# Patient Record
Sex: Female | Born: 1975
Health system: Southern US, Community
[De-identification: ages and names within clinical notes are randomized; demographics above are authoritative.]

## PROBLEM LIST (undated history)

## (undated) DIAGNOSIS — F329 Major depressive disorder, single episode, unspecified: Secondary | ICD-10-CM

## (undated) DIAGNOSIS — F32A Depression, unspecified: Secondary | ICD-10-CM

## (undated) DIAGNOSIS — J45909 Unspecified asthma, uncomplicated: Secondary | ICD-10-CM

## (undated) DIAGNOSIS — L309 Dermatitis, unspecified: Secondary | ICD-10-CM

## (undated) DIAGNOSIS — G4730 Sleep apnea, unspecified: Secondary | ICD-10-CM

## (undated) DIAGNOSIS — I10 Essential (primary) hypertension: Secondary | ICD-10-CM

## (undated) DIAGNOSIS — F319 Bipolar disorder, unspecified: Secondary | ICD-10-CM

## (undated) DIAGNOSIS — F909 Attention-deficit hyperactivity disorder, unspecified type: Secondary | ICD-10-CM

## (undated) HISTORY — DX: Essential (primary) hypertension: I10

## (undated) HISTORY — PX: REDUCTION MAMMAPLASTY: SUR839

## (undated) HISTORY — DX: Attention-deficit hyperactivity disorder, unspecified type: F90.9

## (undated) HISTORY — DX: Unspecified asthma, uncomplicated: J45.909

## (undated) HISTORY — PX: CHOLECYSTECTOMY: SHX55

---

## 2012-07-19 ENCOUNTER — Encounter (HOSPITAL_COMMUNITY): Payer: Self-pay

## 2012-07-19 ENCOUNTER — Emergency Department (INDEPENDENT_AMBULATORY_CARE_PROVIDER_SITE_OTHER)
Admission: EM | Admit: 2012-07-19 | Discharge: 2012-07-19 | Disposition: A | Discharge status: Home / Self Care | Payer: Self-pay | Source: Home / Self Care | Attending: Emergency Medicine | Admitting: Emergency Medicine

## 2012-07-19 DIAGNOSIS — T148XXA Other injury of unspecified body region, initial encounter: Secondary | ICD-10-CM

## 2012-07-19 DIAGNOSIS — B86 Scabies: Secondary | ICD-10-CM

## 2012-07-19 HISTORY — DX: Depression, unspecified: F32.A

## 2012-07-19 HISTORY — DX: Dermatitis, unspecified: L30.9

## 2012-07-19 HISTORY — DX: Major depressive disorder, single episode, unspecified: F32.9

## 2012-07-19 LAB — POCT URINALYSIS DIP (DEVICE)
Bilirubin Urine: NEGATIVE
Glucose, UA: NEGATIVE mg/dL
Hgb urine dipstick: NEGATIVE
Ketones, ur: NEGATIVE mg/dL
Leukocytes, UA: NEGATIVE
Nitrite: NEGATIVE
Protein, ur: NEGATIVE mg/dL
Specific Gravity, Urine: 1.025 (ref 1.005–1.030)
Urobilinogen, UA: 0.2 mg/dL (ref 0.0–1.0)
pH: 6 (ref 5.0–8.0)

## 2012-07-19 LAB — POCT PREGNANCY, URINE: Preg Test, Ur: NEGATIVE

## 2012-07-19 MED ORDER — NAPROXEN 500 MG PO TABS: 500.0000 mg | ORAL_TABLET | Freq: Two times a day (BID) | ORAL | Status: AC

## 2012-07-19 MED ORDER — CYCLOBENZAPRINE HCL 5 MG PO TABS: 5.0000 mg | ORAL_TABLET | Freq: Three times a day (TID) | ORAL | Status: AC | PRN

## 2012-07-19 MED ORDER — PERMETHRIN 5 % EX CREA: TOPICAL_CREAM | CUTANEOUS | Status: AC

## 2012-07-19 NOTE — ED Provider Notes (Signed)
History     CSN: [id number]  Arrival date & time 07/19/12  1813   First MD Initiated Contact with Patient 07/19/12 1951      Chief Complaint  Patient presents with  . Back Pain  . Rash    (Consider location/radiation/quality/duration/timing/severity/associated sxs/prior treatment) HPI Comments: Pt reports moving here from [place] 3 weeks ago; in [facility], [place], she was very active, here she sits in the house all day and is very inactive. Began having back pain 4 days ago, woke up with it.  Denies injury.    Also reports was exposed to scabies a month ago, received some sort of treatment for it, and then 2 days later developed a rash on her L foot and ankle and L hand.  Describes early rash as looking like ant bites, then it opened up and now looks like a wound. Describes as very itchy.   Patient is a 36 y.o. female presenting with back pain and rash. The history is provided by the patient.  Back Pain  This is a new problem. Episode onset: 4 days ago. The problem occurs constantly. The problem has not changed since onset.The pain is associated with no known injury. The pain is present in the lumbar spine. The quality of the pain is described as aching. The pain does not radiate. The pain is at a severity of 3/10. The pain is mild. The symptoms are aggravated by bending and twisting. The pain is the same all the time. Pertinent negatives include no fever, no numbness, no abdominal pain, no bowel incontinence, no bladder incontinence, no dysuria and no weakness. She has tried nothing for the symptoms. Risk factors include obesity, lack of exercise and a sedentary lifestyle.  Rash  This is a new problem. Episode onset: a month ago. The problem has not changed since onset.The problem is associated with an unknown factor. There has been no fever. The rash is present on the left foot, left ankle and left hand. The patient is experiencing no pain. Associated symptoms include itching. She has tried nothing for the  symptoms.    Past Medical History  Diagnosis Date  . Depression   . Eczema     Past Surgical History  Procedure Date  . Cholecystectomy     History reviewed. No pertinent family history.  History  Substance Use Topics  . Smoking status: Current Everyday Smoker  . Smokeless tobacco: Not on file  . Alcohol Use: Yes    OB History    Grav Para Term Preterm Abortions TAB SAB Ect Mult Living                  Review of Systems  Constitutional: Negative for fever and chills.  Gastrointestinal: Negative for abdominal pain and bowel incontinence.  Genitourinary: Negative for bladder incontinence, dysuria, flank pain and difficulty urinating.  Musculoskeletal: Positive for back pain.  Skin: Positive for itching and rash.  Neurological: Negative for weakness and numbness.    Allergies  Review of patient's allergies indicates no known allergies.  Home Medications   Current Outpatient Rx  Name Route Sig Dispense Refill  . BUPROPION HCL ER (SR) 150 MG PO TB12 Oral Take 150 mg by mouth 2 (two) times daily.    Marland Kitchen RISPERIDONE 0.25 MG PO TABS Oral Take 6 mg by mouth at bedtime.    . SERTRALINE HCL 100 MG PO TABS Oral Take 100 mg by mouth daily.    . CYCLOBENZAPRINE HCL 5 MG PO TABS Oral Take  1 tablet (5 mg total) by mouth 3 (three) times daily as needed for muscle spasms. 21 tablet 0  . NAPROXEN 500 MG PO TABS Oral Take 1 tablet (500 mg total) by mouth 2 (two) times daily. 14 tablet 0  . PERMETHRIN 5 % EX CREA  Apply from neck down to soles of feet, wash off after 8-14 hours. 60 g 0    BP 150/94  Pulse 76  Temp 98.8 F (37.1 C) (Oral)  Resp 18  SpO2 95%  LMP 06/28/2012  Physical Exam  Constitutional: She appears well-developed and well-nourished. No distress.  Musculoskeletal:       Lumbar back: She exhibits tenderness. She exhibits normal range of motion, no bony tenderness, no swelling and no deformity.       Lumbar paraspinous muscles tender to palp, and tender with  ROM.    Skin: Skin is warm and dry. Rash noted.       3 clusters of open, ulcerated areas of skin on L medial ankle, L anterior proximal foot, and on L dorsal hand, c/w late scabies.      ED Course  Procedures (including critical care time)   Labs Reviewed  POCT URINALYSIS DIP (DEVICE)  POCT PREGNANCY, URINE   No results found.   1. Scabies   2. Muscle strain       MDM  Rash areas most likely scabies given exposure and rash appearance, however, no burrows noted.  Pt to monitor and seek further help if permethrin tx does not resolve rash.          Cathlyn Parsons, NP 07/19/12 2034

## 2012-07-19 NOTE — ED Notes (Signed)
C/o pain in center of lower back for 4 days.  Denies heavy lifting or strenuous activity.  States she thinks it is a UTI- denies urinary sx.  Also c/o itchy rash to lt foot for 1 month.

## 2012-07-20 NOTE — ED Provider Notes (Signed)
Medical screening examination/treatment/procedure(s) were performed by non-physician practitioner and as supervising physician I was immediately available for consultation/collaboration.  Raynald Blend, MD 07/20/12 1204

## 2013-10-09 ENCOUNTER — Ambulatory Visit: Payer: Self-pay | Attending: Internal Medicine

## 2013-10-10 ENCOUNTER — Ambulatory Visit: Payer: Self-pay

## 2013-11-27 ENCOUNTER — Ambulatory Visit: Payer: Self-pay | Admitting: Internal Medicine

## 2014-05-13 ENCOUNTER — Encounter (HOSPITAL_COMMUNITY): Payer: Self-pay | Admitting: Emergency Medicine

## 2014-05-13 ENCOUNTER — Emergency Department (HOSPITAL_COMMUNITY)
Admission: EM | Admit: 2014-05-13 | Discharge: 2014-05-13 | Disposition: A | Discharge status: Home / Self Care | Payer: BC Managed Care – PPO | Source: Home / Self Care

## 2014-05-13 DIAGNOSIS — M545 Low back pain, unspecified: Secondary | ICD-10-CM

## 2014-05-13 LAB — POCT URINALYSIS DIP (DEVICE)
Bilirubin Urine: NEGATIVE
Glucose, UA: NEGATIVE mg/dL
Hgb urine dipstick: NEGATIVE
Ketones, ur: NEGATIVE mg/dL
Leukocytes, UA: NEGATIVE
Nitrite: NEGATIVE
Protein, ur: NEGATIVE mg/dL
Specific Gravity, Urine: 1.015 (ref 1.005–1.030)
Urobilinogen, UA: 0.2 mg/dL (ref 0.0–1.0)
pH: 6.5 (ref 5.0–8.0)

## 2014-05-13 LAB — POCT PREGNANCY, URINE: Preg Test, Ur: NEGATIVE

## 2014-05-13 MED ORDER — DICLOFENAC POTASSIUM 50 MG PO TABS: 50.0000 mg | ORAL_TABLET | Freq: Three times a day (TID) | ORAL | Status: DC

## 2014-05-13 NOTE — Discharge Instructions (Signed)
Back Pain, Adult °Low back pain is very common. About 1 in 5 people have back pain. The cause of low back pain is rarely dangerous. The pain often gets better over time. About half of people with a sudden onset of back pain feel better in just 2 weeks. About 8 in 10 people feel better by 6 weeks.  °CAUSES °Some common causes of back pain include: °· Strain of the muscles or ligaments supporting the spine. °· Wear and tear (degeneration) of the spinal discs. °· Arthritis. °· Direct injury to the back. °DIAGNOSIS °Most of the time, the direct cause of low back pain is not known. However, back pain can be treated effectively even when the exact cause of the pain is unknown. Answering your caregiver's questions about your overall health and symptoms is one of the most accurate ways to make sure the cause of your pain is not dangerous. If your caregiver needs more information, he or she may order lab work or imaging tests (X-rays or MRIs). However, even if imaging tests show changes in your back, this usually does not require surgery. °HOME CARE INSTRUCTIONS °For many people, back pain returns. Since low back pain is rarely dangerous, it is often a condition that people can learn to manage on their own.  °· Remain active. It is stressful on the back to sit or stand in one place. Do not sit, drive, or stand in one place for more than 30 minutes at a time. Take short walks on level surfaces as soon as pain allows. Try to increase the length of time you walk each day. °· Do not stay in bed. Resting more than 1 or 2 days can delay your recovery. °· Do not avoid exercise or work. Your body is made to move. It is not dangerous to be active, even though your back may hurt. Your back will likely heal faster if you return to being active before your pain is gone. °· Pay attention to your body when you  bend and lift. Many people have less discomfort when lifting if they bend their knees, keep the load close to their bodies, and  avoid twisting. Often, the most comfortable positions are those that put less stress on your recovering back. °· Find a comfortable position to sleep. Use a firm mattress and lie on your side with your knees slightly bent. If you lie on your back, put a pillow under your knees. °· Only take over-the-counter or prescription medicines as directed by your caregiver. Over-the-counter medicines to reduce pain and inflammation are often the most helpful. Your caregiver may prescribe muscle relaxant drugs. These medicines help dull your pain so you can more quickly return to your normal activities and healthy exercise. °· Put ice on the injured area. °· Put ice in a plastic bag. °· Place a towel between your skin and the bag. °· Leave the ice on for 15-20 minutes, 03-04 times a day for the first 2 to 3 days. After that, ice and heat may be alternated to reduce pain and spasms. °· Ask your caregiver about trying back exercises and gentle massage. This may be of some benefit. °· Avoid feeling anxious or stressed. Stress increases muscle tension and can worsen back pain. It is important to recognize when you are anxious or stressed and learn ways to manage it. Exercise is a great option. °SEEK MEDICAL CARE IF: °· You have pain that is not relieved with rest or medicine. °· You have pain that does not improve in 1 week. °· You have new symptoms. °· You are generally not feeling well. °SEEK   IMMEDIATE MEDICAL CARE IF:  °· You have pain that radiates from your back into your legs. °· You develop new bowel or bladder control problems. °· You have unusual weakness or numbness in your arms or legs. °· You develop nausea or vomiting. °· You develop abdominal pain. °· You feel faint. °Document Released: 11/27/2005 Document Revised: 05/28/2012 Document Reviewed: 04/17/2011 °ExitCare® Patient Information ©2014 [employer]. ° °Lumbosacral Strain °Lumbosacral strain is a strain of any of the parts that make up your lumbosacral  vertebrae. Your lumbosacral vertebrae are the bones that make up the lower third of your backbone. Your lumbosacral vertebrae are held together by muscles and tough, fibrous tissue (ligaments).  °CAUSES  °A sudden blow to your back can cause lumbosacral strain. Also, anything that causes an excessive stretch of the muscles in the low back can cause this strain. This is typically seen when people exert themselves strenuously, fall, lift heavy objects, bend, or crouch repeatedly. °RISK FACTORS °· Physically demanding work. °· Participation in pushing or pulling sports or sports that require sudden twist of the back (tennis, golf, baseball). °· Weight lifting. °· Excessive lower back curvature. °· Forward-tilted pelvis. °· Weak back or abdominal muscles or both. °· Tight hamstrings. °SIGNS AND SYMPTOMS  °Lumbosacral strain may cause pain in the area of your injury or pain that moves (radiates) down your leg.  °DIAGNOSIS °Your health care provider can often diagnose lumbosacral strain through a physical exam. In some cases, you may need tests such as X-ray exams.  °TREATMENT  °Treatment for your lower back injury depends on many factors that your clinician will have to evaluate. However, most treatment will include the use of anti-inflammatory medicines. °HOME CARE INSTRUCTIONS  °· Avoid hard physical activities (tennis, racquetball, waterskiing) if you are not in proper physical condition for it. This may aggravate or create problems. °· If you have a back problem, avoid sports requiring sudden body movements. Swimming and walking are generally safer activities. °· Maintain good posture. °· Maintain a healthy weight. °· For acute conditions, you may put ice on the injured area. °· Put ice in a plastic bag. °· Place a towel between your skin and the bag. °· Leave the ice on for 20 minutes, 2 3 times a day. °· When the low back starts healing, stretching and strengthening exercises may be recommended. °SEEK MEDICAL CARE  IF: °· Your back pain is getting worse. °· You experience severe back pain not relieved with medicines. °SEEK IMMEDIATE MEDICAL CARE IF:  °· You have numbness, tingling, weakness, or problems with the use of your arms or legs. °· There is a change in bowel or bladder control. °· You have increasing pain in any area of the body, including your belly (abdomen). °· You notice shortness of breath, dizziness, or feel faint. °· You feel sick to your stomach (nauseous), are throwing up (vomiting), or become sweaty. °· You notice discoloration of your toes or legs, or your feet get very cold. °MAKE SURE YOU:  °· Understand these instructions. °· Will watch your condition. °· Will get help right away if you are not doing well or get worse. °Document Released: 09/06/2005 Document Revised: 09/17/2013 Document Reviewed: 07/16/2013 °ExitCare® Patient Information ©2014 [employer]. ° °

## 2014-05-13 NOTE — ED Provider Notes (Signed)
Medical screening examination/treatment/procedure(s) were performed by non-physician practitioner and as supervising physician I was immediately available for consultation/collaboration.  Leslee Home, M.D.  Reuben Likes, MD 05/13/14 1346

## 2014-05-13 NOTE — ED Notes (Signed)
C/o left lower back pain x 1 week. Pain worse first thing in AM, sometimes better after she gets up and moves around. Denies UA pain, frequency, urgency. 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

## 2014-05-13 NOTE — ED Provider Notes (Signed)
CSN: [id number]     Arrival date & time 05/13/14  [id number] History   None    Chief Complaint  Patient presents with  . Back Pain   (Consider location/radiation/quality/duration/timing/severity/associated sxs/prior Treatment) HPI Comments: 38 year old female complaining of low mid back pain for approximately one week. Pain is located in the lower lumbar spine approximately L4-L5. The pain is worse upon getting up in the morning, a little better when moving around and much worse with prolonged standing. There is no radiation of pain, no radicular pain. Denies numbness, tingling or focal weakness.   Past Medical History  Diagnosis Date  . Depression   . Eczema    Past Surgical History  Procedure Laterality Date  . Cholecystectomy     History reviewed. No pertinent family history. History  Substance Use Topics  . Smoking status: Current Every Day Smoker  . Smokeless tobacco: Not on file  . Alcohol Use: Yes   OB History   Grav Para Term Preterm Abortions TAB SAB Ect Mult Living                 Review of Systems  Constitutional: Negative for fever and activity change.  HENT: Negative.   Respiratory: Negative.   Cardiovascular: Negative.   Musculoskeletal: Positive for back pain. Negative for joint swelling, myalgias and neck pain.       As per HPI  Skin: Negative for color change and rash.  Neurological: Negative.     Allergies  Review of patient's allergies indicates no known allergies.  Home Medications   Prior to Admission medications   Medication Sig Start Date End Date Taking? Authorizing Provider  buPROPion (WELLBUTRIN SR) 150 MG 12 hr tablet Take 150 mg by mouth 2 (two) times daily.    Historical Provider, MD  diclofenac (CATAFLAM) 50 MG tablet Take 1 tablet (50 mg total) by mouth 3 (three) times daily. Prn back pain. Take with food. 05/13/14   Hayden Rasmussenavid Yariana Hoaglund, NP  risperiDONE (RISPERDAL) 0.25 MG tablet Take 6 mg by mouth at bedtime.    Historical Provider, MD  sertraline  (ZOLOFT) 100 MG tablet Take 100 mg by mouth daily.    Historical Provider, MD   BP 134/81  Pulse 61  Temp(Src) 98.3 F (36.8 C) (Oral)  Resp 18  SpO2 98% Physical Exam  Nursing note and vitals reviewed. Constitutional: She is oriented to person, place, and time. She appears well-developed and well-nourished. No distress.  HENT:  Head: Normocephalic and atraumatic.  Eyes: EOM are normal. Pupils are equal, round, and reactive to light.  Neck: Normal range of motion. Neck supple.  Cardiovascular: Normal rate.   Pulmonary/Chest: Effort normal. No respiratory distress.  Musculoskeletal: Normal range of motion. She exhibits no edema.  Minor tenderness over lower lumbar vertabra. No swelling, deformity or discoloration.  Forward flexion of spine left and right without limitation and minimal lumbar discomfort. Rotation is nl. Distal motor and sensory intact. Nl gait.  Neurological: She is alert and oriented to person, place, and time. No cranial nerve deficit.  Skin: Skin is warm and dry.  Psychiatric: She has a normal mood and affect.    ED Course  Procedures (including critical care time) Labs Review Labs Reviewed  POCT URINALYSIS DIP (DEVICE)  POCT PREGNANCY, URINE    Imaging Review No results found.   MDM   1. Low back pain     Stretches, heat or ice.  cataflam for pain prn No pulling and avoid prolonged standing.  Hayden Rasmussen, NP 05/13/14 [id number]  Hayden Rasmussen, NP 05/13/14 [phone]

## 2015-03-10 ENCOUNTER — Encounter (HOSPITAL_COMMUNITY): Payer: Self-pay | Admitting: Emergency Medicine

## 2015-03-10 ENCOUNTER — Emergency Department (HOSPITAL_COMMUNITY)
Admission: EM | Admit: 2015-03-10 | Discharge: 2015-03-10 | Disposition: A | Discharge status: Home / Self Care | Payer: 59 | Source: Home / Self Care | Attending: Family Medicine | Admitting: Family Medicine

## 2015-03-10 DIAGNOSIS — M545 Low back pain, unspecified: Secondary | ICD-10-CM

## 2015-03-10 LAB — POCT URINALYSIS DIP (DEVICE)
Bilirubin Urine: NEGATIVE
Glucose, UA: NEGATIVE mg/dL
Hgb urine dipstick: NEGATIVE
Ketones, ur: NEGATIVE mg/dL
Leukocytes, UA: NEGATIVE
Nitrite: NEGATIVE
Protein, ur: NEGATIVE mg/dL
Specific Gravity, Urine: 1.025 (ref 1.005–1.030)
Urobilinogen, UA: 0.2 mg/dL (ref 0.0–1.0)
pH: 6.5 (ref 5.0–8.0)

## 2015-03-10 LAB — POCT PREGNANCY, URINE: Preg Test, Ur: NEGATIVE

## 2015-03-10 MED ORDER — DICLOFENAC POTASSIUM 50 MG PO TABS: 50.0000 mg | ORAL_TABLET | Freq: Two times a day (BID) | ORAL | Status: DC

## 2015-03-10 NOTE — Discharge Instructions (Signed)
Thank you for coming in today. Come back or go to the emergency room if you notice new weakness new numbness problems walking or bowel or bladder problems. Call or go to the emergency room if you get worse, have trouble breathing, have chest pains, or palpitations.    Back Pain, Adult Low back pain is very common. About 1 in 5 people have back pain.The cause of low back pain is rarely dangerous. The pain often gets better over time.About half of people with a sudden onset of back pain feel better in just 2 weeks. About 8 in 10 people feel better by 6 weeks.  CAUSES Some common causes of back pain include:  Strain of the muscles or ligaments supporting the spine.  Wear and tear (degeneration) of the spinal discs.  Arthritis.  Direct injury to the back. DIAGNOSIS Most of the time, the direct cause of low back pain is not known.However, back pain can be treated effectively even when the exact cause of the pain is unknown.Answering your caregiver's questions about your overall health and symptoms is one of the most accurate ways to make sure the cause of your pain is not dangerous. If your caregiver needs more information, he or she may order lab work or imaging tests (X-rays or MRIs).However, even if imaging tests show changes in your back, this usually does not require surgery. HOME CARE INSTRUCTIONS For many people, back pain returns.Since low back pain is rarely dangerous, it is often a condition that people can learn to [facility] their own.   Remain active. It is stressful on the back to sit or stand in one place. Do not sit, drive, or stand in one place for more than 30 minutes at a time. Take short walks on level surfaces as soon as pain allows.Try to increase the length of time you walk each day.  Do not stay in bed.Resting more than 1 or 2 days can delay your recovery.  Do not avoid exercise or work.Your body is made to move.It is not dangerous to be active, even though your  back may hurt.Your back will likely heal faster if you return to being active before your pain is gone.  Pay attention to your body when you bend and lift. Many people have less discomfortwhen lifting if they bend their knees, keep the load close to their bodies,and avoid twisting. Often, the most comfortable positions are those that put less stress on your recovering back.  Find a comfortable position to sleep. Use a firm mattress and lie on your side with your knees slightly bent. If you lie on your back, put a pillow under your knees.  Only take over-the-counter or prescription medicines as directed by your caregiver. Over-the-counter medicines to reduce pain and inflammation are often the most helpful.Your caregiver may prescribe muscle relaxant drugs.These medicines help dull your pain so you can more quickly return to your normal activities and healthy exercise.  Put ice on the injured area.  Put ice in a plastic bag.  Place a towel between your skin and the bag.  Leave the ice on for 15-20 minutes, 03-04 times a day for the first 2 to 3 days. After that, ice and heat may be alternated to reduce pain and spasms.  Ask your caregiver about trying back exercises and gentle massage. This may be of some benefit.  Avoid feeling anxious or stressed.Stress increases muscle tension and can worsen back pain.It is important to recognize when you are anxious or stressed  and learn ways to manage it.Exercise is a great option. SEEK MEDICAL CARE IF:  You have pain that is not relieved with rest or medicine.  You have pain that does not improve in 1 week.  You have new symptoms.  You are generally not feeling well. SEEK IMMEDIATE MEDICAL CARE IF:   You have pain that radiates from your back into your legs.  You develop new bowel or bladder control problems.  You have unusual weakness or numbness in your arms or legs.  You develop nausea or vomiting.  You develop abdominal  pain.  You feel faint. Document Released: 11/27/2005 Document Revised: 05/28/2012 Document Reviewed: 03/31/2014 [facility] Patient Information 2015 [place], [place]. This information is not intended to replace advice given to you by your health care provider. Make sure you discuss any questions you have with your health care provider.  Orthostatic Hypotension Orthostatic hypotension is a sudden drop in blood pressure. It happens when you quickly stand up from a seated or lying position. You may feel dizzy or light-headed. This can last for just a few seconds or for up to a few minutes. It is usually not a serious problem. However, if this happens frequently or gets worse, it can be a sign of something more serious. CAUSES  Different things can cause orthostatic hypotension, including:   Loss of body fluids (dehydration).  Medicines that lower blood pressure.  Sudden changes in posture, such as standing up quickly after you have been sitting or lying down.  Taking too much of your medicine. SIGNS AND SYMPTOMS   Light-headedness or dizziness.   Fainting or near-fainting.   A fast heart rate.   Weakness.   Feeling tired (fatigue).  DIAGNOSIS  Your health care provider may do several things to help diagnose your condition and identify the cause. These may include:   Taking a medical history and doing a physical exam.  Checking your blood pressure. Your health care provider will check your blood pressure when you are:  Lying down.  Sitting.  Standing.  Using tilt table testing. In this test, you lie down on a table that moves from a lying position to a standing position. You will be strapped onto the table. This test monitors your blood pressure and heart rate when you are in different positions. TREATMENT  Treatment will vary depending on the cause. Possible treatments include:   Changing the dosage of your medicines.  Wearing compression stockings on your lower  legs.  Standing up slowly after sitting or lying down.  Eating more salt.  Eating frequent, small meals.  In some cases, getting IV fluids.  Taking medicine to enhance fluid retention. HOME CARE INSTRUCTIONS  Only take over-the-counter or prescription medicines as directed by your health care provider.  Follow your health care provider's instructions for changing the dosage of your current medicines.  Do not stop or adjust your medicine on your own.  Stand up slowly after sitting or lying down. This allows your body to adjust to the different position.  Wear compression stockings as directed.  Eat extra salt as directed.  Do not add extra salt to your diet unless directed to by your health care provider.  Eat frequent, small meals.  Avoid standing suddenly after eating.  Avoid hot showers or excessive heat as directed by your health care provider.  Keep all follow-up appointments. SEEK MEDICAL CARE IF:  You continue to feel dizzy or light-headed after standing.  You feel groggy or confused.  You  feel cold, clammy, or sick to your stomach (nauseous).  You have blurred vision.  You feel short of breath. SEEK IMMEDIATE MEDICAL CARE IF:   You faint after standing.  You have chest pain.  You have difficulty breathing.   You lose feeling or movement in your arms or legs.   You have slurred speech or difficulty talking, or you are unable to talk.  MAKE SURE YOU:   Understand these instructions.  Will watch your condition.  Will get help right away if you are not doing well or get worse. Document Released: 11/17/2002 Document Revised: 12/02/2013 Document Reviewed: 09/19/2013 [facility] Patient Information 2015 [place], [place]. This information is not intended to replace advice given to you by your health care provider. Make sure you discuss any questions you have with your health care provider.

## 2015-03-10 NOTE — ED Notes (Signed)
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

## 2015-03-10 NOTE — ED Provider Notes (Signed)
Julie Harrington is a 39 y.o. female who presents to Urgent Care today for back pain. Patient has a one-day history of right-sided low back pain. She felt a pulling sensation while at work yesterday. She denies any radiating pain weakness or numbness fevers or chills nausea vomiting or diarrhea. No chest pains or palpitations. She denies any urinary symptoms. She has not tried any medications yet.  Patient stood up to go to the bathroom and developed significant lightheadedness. She denies feeling as if she were to pass out or a room spinning sensation. She denies any palpitations or chest pain during these symptoms.   Past Medical History  Diagnosis Date  . Depression   . Eczema    Past Surgical History  Procedure Laterality Date  . Cholecystectomy     History  Substance Use Topics  . Smoking status: Current Every Day Smoker  . Smokeless tobacco: Not on file  . Alcohol Use: Yes   ROS as above Medications: No current facility-administered medications for this encounter.   Current Outpatient Prescriptions  Medication Sig Dispense Refill  . buPROPion (WELLBUTRIN SR) 150 MG 12 hr tablet Take 150 mg by mouth 2 (two) times daily.    . risperiDONE (RISPERDAL) 0.25 MG tablet Take 6 mg by mouth at bedtime.    . sertraline (ZOLOFT) 100 MG tablet Take 100 mg by mouth daily.    . diclofenac (CATAFLAM) 50 MG tablet Take 1 tablet (50 mg total) by mouth 2 (two) times daily. Prn back pain. Take with food. 30 tablet 0   No Known Allergies   Exam:  BP 145/73 mmHg  Pulse 64  Temp(Src) 98.4 F (36.9 C) (Oral)  Resp 18  SpO2 99%  Orthostatic VS for the past 24 hrs:  BP- Lying Pulse- Lying BP- Sitting Pulse- Sitting BP- Standing at 0 minutes Pulse- Standing at 0 minutes  03/10/15 1254 151/84 mmHg 55 153/88 mmHg 65 (!) 162/95 mmHg 78    Gen: Well NAD HEENT: EOMI,  MMM Lungs: Normal work of breathing. CTABL Heart: RRR no MRG Abd: NABS, Soft. Nondistended, Nontender no CV angle tenderness to  percussion Exts: Brisk capillary refill, warm and well perfused.  Tach: Nontender to spinal midline. Tender palpation right SI joint. Negative straight leg and Faber test bilaterally. Reflexes are equal and normal bilateral knees and ankles. Neuro: Alert and oriented cranial nerves II through XII are intact normal gait and balance coordination   ED ECG REPORT   Date: 03/10/2015  Rate: 56  Rhythm: sinus bradycardia  QRS Axis: normal  Intervals: normal  ST/T Wave abnormalities: normal  Conduction Disutrbances:none  Narrative Interpretation:   Old EKG Reviewed: none available  I have personally reviewed the EKG tracing and agree with the computerized printout as noted.  Results for orders placed or performed during the hospital encounter of 03/10/15 (from the past 24 hour(s))  POCT urinalysis dip (device)     Status: None   Collection Time: 03/10/15 12:41 PM  Result Value Ref Range   Glucose, UA NEGATIVE NEGATIVE mg/dL   Bilirubin Urine NEGATIVE NEGATIVE   Ketones, ur NEGATIVE NEGATIVE mg/dL   Specific Gravity, Urine 1.025 1.005 - 1.030   Hgb urine dipstick NEGATIVE NEGATIVE   pH 6.5 5.0 - 8.0   Protein, ur NEGATIVE NEGATIVE mg/dL   Urobilinogen, UA 0.2 0.0 - 1.0 mg/dL   Nitrite NEGATIVE NEGATIVE   Leukocytes, UA NEGATIVE NEGATIVE  Pregnancy, urine POC     Status: None   Collection Time: 03/10/15 12:41  PM  Result Value Ref Range   Preg Test, Ur NEGATIVE NEGATIVE   No results found.  Assessment and Plan: 39 y.o. female with  1) lumbago due to myofascial disruption. Treat with diclofenac and watchful waiting. 2) dizziness. Very transient. Patient is completely asymptomatic currently. EKG is normal with exception of mild sinus bradycardia. Orthostatic she notes significant decrease in blood pressure. Recommend follow-up with PCP.  Discussed warning signs or symptoms. Please see discharge instructions. Patient expresses understanding.     Rodolph BongEvan S Melvia Matousek, MD 03/10/15 [phone]

## 2015-06-02 ENCOUNTER — Encounter (HOSPITAL_COMMUNITY): Payer: Self-pay | Admitting: Emergency Medicine

## 2015-06-02 ENCOUNTER — Emergency Department (HOSPITAL_COMMUNITY)
Admission: EM | Admit: 2015-06-02 | Discharge: 2015-06-02 | Disposition: A | Discharge status: Home / Self Care | Payer: 59 | Source: Home / Self Care | Attending: Emergency Medicine | Admitting: Emergency Medicine

## 2015-06-02 DIAGNOSIS — M545 Low back pain, unspecified: Secondary | ICD-10-CM

## 2015-06-02 MED ORDER — CYCLOBENZAPRINE HCL 10 MG PO TABS: 10.0000 mg | ORAL_TABLET | Freq: Every evening | ORAL | Status: DC | PRN

## 2015-06-02 MED ORDER — DICLOFENAC POTASSIUM 50 MG PO TABS: 50.0000 mg | ORAL_TABLET | Freq: Two times a day (BID) | ORAL | Status: DC

## 2015-06-02 NOTE — ED Notes (Signed)
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

## 2015-06-02 NOTE — Discharge Instructions (Signed)
You likely pulled a muscle in your back. Take diclofenac twice a day for the next 3 days, then as needed. Take Flexeril at bedtime as needed. Alternate ice and heat to lower back. You should see improvement in the next few days, but this will take 2 weeks to fully heal. Follow-up as needed.

## 2015-06-02 NOTE — ED Provider Notes (Signed)
CSN: [id number]     Arrival date & time 06/02/15  1648 History   First MD Initiated Contact with Patient 06/02/15 1701     Chief Complaint  Patient presents with  . Back Pain   (Consider location/radiation/quality/duration/timing/severity/associated sxs/prior Treatment) HPI  She is a 39 year old woman here for evaluation of low back pain. This started yesterday. The pain is located in her tailbone. It is worse with standing and sitting as well as flexion and extension. If she is just sitting at rest, it does not really hurt. She has not tried any treatments. 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 No bowel or bladder incontinence. No radicular pain. No numbness, tingling, weakness in her lower extremities. No dysuria or urinary frequency.  Past Medical History  Diagnosis Date  . Depression   . Eczema    Past Surgical History  Procedure Laterality Date  . Cholecystectomy     History reviewed. No pertinent family history. History  Substance Use Topics  . Smoking status: Current Every Day Smoker  . Smokeless tobacco: Not on file  . Alcohol Use: Yes   OB History    No data available     Review of Systems As in history of present illness Allergies  Review of patient's allergies indicates no known allergies.  Home Medications   Prior to Admission medications   Medication Sig Start Date End Date Taking? Authorizing Provider  buPROPion (WELLBUTRIN SR) 150 MG 12 hr tablet Take 150 mg by mouth 2 (two) times daily.    Historical Provider, MD  cyclobenzaprine (FLEXERIL) 10 MG tablet Take 1 tablet (10 mg total) by mouth at bedtime as needed for muscle spasms. 06/02/15   Charm Rings, MD  diclofenac (CATAFLAM) 50 MG tablet Take 1 tablet (50 mg total) by mouth 2 (two) times daily. Prn back pain. Take with food. 06/02/15   Charm Rings, MD  risperiDONE (RISPERDAL) 0.25 MG tablet Take 6 mg by mouth at bedtime.    Historical Provider, MD  sertraline (ZOLOFT) 100 MG tablet  Take 100 mg by mouth daily.    Historical Provider, MD   BP 142/84 mmHg  Pulse 73  Temp(Src) 99.1 F (37.3 C) (Oral)  Resp 14  SpO2 100% Physical Exam  Constitutional: She is oriented to person, place, and time. She appears well-developed and well-nourished. No distress.  Cardiovascular: Normal rate.   Pulmonary/Chest: Effort normal.  Musculoskeletal:  Back: No erythema or edema. No vertebral tenderness or step-offs. No point tenderness. No appreciable muscle spasm. She has full active range of motion, but some pain with flexion and extension. Negative straight leg raise bilaterally. 5 out of 5 strength in bilateral lower extremities.  Neurological: She is alert and oriented to person, place, and time.    ED Course  Procedures (including critical care time) Labs Review Labs Reviewed - No data to display  Imaging Review No results found.   MDM   1. Midline low back pain without sciatica    Conservative management with ice/heat, diclofenac, Flexeril. Work note provided. Follow-up as needed.    Charm Rings, MD 06/02/15 [phone]

## 2015-09-22 ENCOUNTER — Encounter (HOSPITAL_COMMUNITY): Payer: Self-pay | Admitting: Emergency Medicine

## 2015-09-22 ENCOUNTER — Emergency Department (HOSPITAL_COMMUNITY)
Admission: EM | Admit: 2015-09-22 | Discharge: 2015-09-22 | Disposition: A | Discharge status: Home / Self Care | Payer: 59 | Source: Home / Self Care | Attending: Family Medicine | Admitting: Family Medicine

## 2015-09-22 DIAGNOSIS — M222X1 Patellofemoral disorders, right knee: Secondary | ICD-10-CM

## 2015-09-22 MED ORDER — DICLOFENAC SODIUM 75 MG PO TBEC: 75.0000 mg | DELAYED_RELEASE_TABLET | Freq: Two times a day (BID) | ORAL | Status: DC

## 2015-09-22 NOTE — Discharge Instructions (Signed)
You have developed patellofemoral syndrome this is due to the kneecap rubbing irregularly over the knee joint. Please start the leg exercises demonstrated. The handout below has additional exercise he can consider trying. Please use the Voltaren for additional pain relief. If her symptoms do not improve please follow-up with orthopedic surgery or sports medicine.

## 2015-09-22 NOTE — ED Provider Notes (Signed)
CSN: [id number]     Arrival date & time 09/22/15  1528 History   First MD Initiated Contact with Patient 09/22/15 1546     Chief Complaint  Patient presents with  . Knee Pain   (Consider location/radiation/quality/duration/timing/severity/associated sxs/prior Treatment) HPI   R knee pain. Started this morning when pt awoke from sleep. Just above kneecap. No radiation. Difficulty bending knee. Has not tried anything for the pain. No change in exercise routine or work environment. Constant w/ waxing and waning nature.  Worse w/ bending knee but not when standing.   Denies any swelling, rash, other joint involvement, fevers, chest pain, shortness of breath.   Past Medical History  Diagnosis Date  . Depression   . Eczema    Past Surgical History  Procedure Laterality Date  . Cholecystectomy     Family History  Problem Relation Age of Onset  . Hypertension Other   . Diabetes Other    Social History  Substance Use Topics  . Smoking status: Current Every Day Smoker  . Smokeless tobacco: None  . Alcohol Use: Yes   OB History    No data available     Review of Systems  Allergies  Review of patient's allergies indicates no known allergies.  Home Medications   Prior to Admission medications   Medication Sig Start Date End Date Taking? Authorizing Provider  amLODipine (NORVASC) 5 MG tablet Take 5 mg by mouth daily.   Yes Historical Provider, MD  hydrochlorothiazide (MICROZIDE) 12.5 MG capsule Take 12.5 mg by mouth daily.   Yes Historical Provider, MD  buPROPion (WELLBUTRIN SR) 150 MG 12 hr tablet Take 150 mg by mouth 2 (two) times daily.    Historical Provider, MD  cyclobenzaprine (FLEXERIL) 10 MG tablet Take 1 tablet (10 mg total) by mouth at bedtime as needed for muscle spasms. 06/02/15   Charm Rings, MD  diclofenac (CATAFLAM) 50 MG tablet Take 1 tablet (50 mg total) by mouth 2 (two) times daily. Prn back pain. Take with food. 06/02/15   Charm Rings, MD  diclofenac  (VOLTAREN) 75 MG EC tablet Take 1 tablet (75 mg total) by mouth 2 (two) times daily. 09/22/15   Ozella Rocks, MD  risperiDONE (RISPERDAL) 0.25 MG tablet Take 6 mg by mouth at bedtime.    Historical Provider, MD  sertraline (ZOLOFT) 100 MG tablet Take 100 mg by mouth daily.    Historical Provider, MD   Meds Ordered and Administered this Visit  Medications - No data to display  BP 100/69 mmHg  Pulse 77  Temp(Src) 98.7 F (37.1 C) (Oral)  Resp 16  SpO2 97% No data found.   Physical Exam Physical Exam  Constitutional: oriented to person, place, and time. appears well-developed and well-nourished. No distress.  HENT:  Head: Normocephalic and atraumatic.  Eyes: EOMI. PERRL.  Neck: Normal range of motion.  Cardiovascular: RRR, no m/r/g, 2+ distal pulses,  Pulmonary/Chest: Effort normal and breath sounds normal. No respiratory distress.  Abdominal: Soft. Bowel sounds are normal. NonTTP, no distension.  Musculoskeletal: Right knee full range of motion, mild crepitus appreciated, Lachman's negative, valgus and varus stresses without pain, apprehension test negative. Neurological: alert and oriented to person, place, and time.  Skin: Skin is warm. No rash noted. non diaphoretic.  Psychiatric: normal mood and affect. behavior is normal. Judgment and thought content normal.   ED Course  Procedures (including critical care time)  Labs Review Labs Reviewed - No data to display  Imaging Review  No results found.   Visual Acuity Review  Right Eye Distance:   Left Eye Distance:   Bilateral Distance:    Right Eye Near:   Left Eye Near:    Bilateral Near:         MDM   1. PFS (patellofemoral syndrome), right    Meloxicam, extremity exercises, follow-up with primary care physician, sports medicine or ortho    Ozella Rocksavid J Vivien Barretto, MD 09/22/15 1626

## 2015-09-22 NOTE — ED Notes (Signed)
Pain in right knee, pain worse with ambulation/weight bearing.

## 2018-11-28 ENCOUNTER — Emergency Department (HOSPITAL_COMMUNITY)
Admission: EM | Admit: 2018-11-28 | Discharge: 2018-11-28 | Disposition: A | Discharge status: Home / Self Care | Payer: BLUE CROSS/BLUE SHIELD | Attending: Emergency Medicine | Admitting: Emergency Medicine

## 2018-11-28 ENCOUNTER — Other Ambulatory Visit: Payer: Self-pay

## 2018-11-28 ENCOUNTER — Encounter (HOSPITAL_COMMUNITY): Payer: Self-pay | Admitting: *Deleted

## 2018-11-28 DIAGNOSIS — L403 Pustulosis palmaris et plantaris: Secondary | ICD-10-CM | POA: Diagnosis not present

## 2018-11-28 DIAGNOSIS — Z79899 Other long term (current) drug therapy: Secondary | ICD-10-CM | POA: Diagnosis not present

## 2018-11-28 DIAGNOSIS — R21 Rash and other nonspecific skin eruption: Secondary | ICD-10-CM | POA: Diagnosis not present

## 2018-11-28 DIAGNOSIS — L408 Other psoriasis: Secondary | ICD-10-CM | POA: Diagnosis not present

## 2018-11-28 MED ORDER — TRIAMCINOLONE ACETONIDE 0.1 % EX CREA: 1.0000 "application " | TOPICAL_CREAM | Freq: Two times a day (BID) | CUTANEOUS | 0 refills | Status: DC

## 2018-11-28 MED ORDER — CEPHALEXIN 500 MG PO CAPS: 500.0000 mg | ORAL_CAPSULE | Freq: Four times a day (QID) | ORAL | 0 refills | Status: DC

## 2018-11-28 NOTE — ED Provider Notes (Signed)
[facility]   CSN: [id number] Arrival date & time: 11/28/18  0848     History   Chief Complaint Chief Complaint  Patient presents with  . Rash    HPI Julie Harrington is a 42 y.o. female.  The history is provided by the patient. No language interpreter was used.  Rash       42 year old female presenting concerning of a rash.  Patient has a history of pustular eczema.  She reports she is having increasing flare on her pain involving her eczema.  For the past 2 weeks it has become more intense.  She complaining of pain, mild itchiness, increased swelling.  Symptom is moderate in severity.  She attributed to having to use hand soap and glove at work on a regular basis.  She denies any fever, throat swelling, trouble breathing, wheezing or abdominal cramping.  She report that the rash only involve her hands.  She does have a dermatologist but the next available appointment is in February.  She was using some types of cream in the past but she no longer has access to the cream.  Past Medical History:  Diagnosis Date  . Depression   . Eczema     There are no active problems to display for this patient.   Past Surgical History:  Procedure Laterality Date  . CHOLECYSTECTOMY       OB History   No obstetric history on file.      Home Medications    Prior to Admission medications   Medication Sig Start Date End Date Taking? Authorizing Provider  amLODipine (NORVASC) 5 MG tablet Take 5 mg by mouth daily.    [provider]  buPROPion (WELLBUTRIN SR) 150 MG 12 hr tablet Take 150 mg by mouth 2 (two) times daily.    [provider]  cyclobenzaprine (FLEXERIL) 10 MG tablet Take 1 tablet (10 mg total) by mouth at bedtime as needed for muscle spasms. 06/02/15   Charm RingsHonig, Erin J, MD  diclofenac (CATAFLAM) 50 MG tablet Take 1 tablet (50 mg total) by mouth 2 (two) times daily. Prn back pain. Take with food. 06/02/15   Charm RingsHonig,  Erin J, MD  diclofenac (VOLTAREN) 75 MG EC tablet Take 1 tablet (75 mg total) by mouth 2 (two) times daily. 09/22/15   Ozella RocksMerrell, David J, MD  hydrochlorothiazide (MICROZIDE) 12.5 MG capsule Take 12.5 mg by mouth daily.    [provider]  risperiDONE (RISPERDAL) 0.25 MG tablet Take 6 mg by mouth at bedtime.    [provider]  sertraline (ZOLOFT) 100 MG tablet Take 100 mg by mouth daily.    [provider]    Family History Family History  Problem Relation Age of Onset  . Hypertension Other   . Diabetes Other     Social History Social History   Tobacco Use  . Smoking status: Current Every Day Smoker  Substance Use Topics  . Alcohol use: Yes  . Drug use: No     Allergies   Patient has no known allergies.   Review of Systems Review of Systems  Constitutional: Negative for fever.  Skin: Positive for rash.     Physical Exam Updated Vital Signs BP (!) 152/90 (BP Location: Right Arm)   Pulse 70   Temp 98.4 F (36.9 C) (Oral)   Resp 18   SpO2 98%   Physical Exam Vitals signs and nursing note reviewed.  Constitutional:  General: She is not in acute distress.    Appearance: She is well-developed.  HENT:     Head: Atraumatic.  Eyes:     Conjunctiva/sclera: Conjunctivae normal.  Neck:     Musculoskeletal: Neck supple.  Skin:    Findings: Rash (Bilateral hands: Psoriatic skin changes with pustular skin eruption noted to multiple digits with mild erythematous plaque to the dorsum of hands.) present.  Neurological:     Mental Status: She is alert.      ED Treatments / Results  Labs (all labs ordered are listed, but only abnormal results are displayed) Labs Reviewed - No data to display  EKG None  Radiology No results found.  Procedures Procedures (including critical care time)  Medications Ordered in ED Medications - No data to display   Initial Impression / Assessment and Plan / ED Course  I have reviewed the triage  vital signs and the nursing notes.  Pertinent labs & imaging results that were available during my care of the patient were reviewed by me and considered in my medical decision making (see chart for details).     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  She has history of eczema.  She does not have any systemic manifestation.  Her condition has been ongoing for the past several weeks.  She would be best managed by a dermatologist as medications that will help with the symptom is outside the confines of the ER protocol.  Due to potential risk of infection, I will prescribe oral antibiotic to use in the event that her symptoms manifests to degree consistent with infection.  Discussed this with patient.  Since topical corticosteroid can be used sparingly, I will prescribe a mid potency topical steroid cream to use in the meantime.  Patient will follow with dermatologist for further care.   Fayrene Helperran, [place]meka Filice, PA-C 11/28/18 0930    Shaune PollackIsaacs, Cameron, MD 11/30/18 [phone]

## 2018-11-28 NOTE — ED Notes (Signed)
Pt verbalized understanding of d/c instructions and has no further questions, VSS, NAD.  

## 2018-11-28 NOTE — Discharge Instructions (Signed)
Use topical creme twice daily for no more than a week. Take keflex only if you notice sign of infection.  Please call and follow up with dermatology as needed for further care.

## 2018-11-28 NOTE — ED Triage Notes (Signed)
States she has eczema and states its the worse its ever been . On her hands

## 2018-12-16 DIAGNOSIS — Z23 Encounter for immunization: Secondary | ICD-10-CM | POA: Diagnosis not present

## 2018-12-16 DIAGNOSIS — I10 Essential (primary) hypertension: Secondary | ICD-10-CM | POA: Diagnosis not present

## 2018-12-16 DIAGNOSIS — F17210 Nicotine dependence, cigarettes, uncomplicated: Secondary | ICD-10-CM | POA: Diagnosis not present

## 2018-12-16 DIAGNOSIS — L309 Dermatitis, unspecified: Secondary | ICD-10-CM | POA: Diagnosis not present

## 2019-01-13 DIAGNOSIS — L732 Hidradenitis suppurativa: Secondary | ICD-10-CM | POA: Diagnosis not present

## 2019-01-13 DIAGNOSIS — L209 Atopic dermatitis, unspecified: Secondary | ICD-10-CM | POA: Diagnosis not present

## 2019-01-14 DIAGNOSIS — I10 Essential (primary) hypertension: Secondary | ICD-10-CM | POA: Diagnosis not present

## 2019-01-14 DIAGNOSIS — F17210 Nicotine dependence, cigarettes, uncomplicated: Secondary | ICD-10-CM | POA: Diagnosis not present

## 2019-04-15 DIAGNOSIS — F17210 Nicotine dependence, cigarettes, uncomplicated: Secondary | ICD-10-CM | POA: Diagnosis not present

## 2019-04-15 DIAGNOSIS — I10 Essential (primary) hypertension: Secondary | ICD-10-CM | POA: Diagnosis not present

## 2020-02-24 ENCOUNTER — Other Ambulatory Visit: Payer: Self-pay

## 2020-02-24 MED ORDER — HALOBETASOL PROPIONATE 0.05 % EX OINT: TOPICAL_OINTMENT | Freq: Every day | CUTANEOUS | 1 refills | Status: DC

## 2020-03-16 ENCOUNTER — Emergency Department (HOSPITAL_COMMUNITY): Payer: BC Managed Care – PPO

## 2020-03-16 ENCOUNTER — Emergency Department (HOSPITAL_COMMUNITY)
Admission: EM | Admit: 2020-03-16 | Discharge: 2020-03-16 | Disposition: A | Discharge status: Home / Self Care | Payer: BC Managed Care – PPO | Attending: Emergency Medicine | Admitting: Emergency Medicine

## 2020-03-16 ENCOUNTER — Encounter (HOSPITAL_COMMUNITY): Payer: Self-pay

## 2020-03-16 DIAGNOSIS — F17210 Nicotine dependence, cigarettes, uncomplicated: Secondary | ICD-10-CM | POA: Diagnosis not present

## 2020-03-16 DIAGNOSIS — K122 Cellulitis and abscess of mouth: Secondary | ICD-10-CM | POA: Diagnosis not present

## 2020-03-16 DIAGNOSIS — L259 Unspecified contact dermatitis, unspecified cause: Secondary | ICD-10-CM | POA: Diagnosis not present

## 2020-03-16 DIAGNOSIS — R21 Rash and other nonspecific skin eruption: Secondary | ICD-10-CM | POA: Diagnosis not present

## 2020-03-16 DIAGNOSIS — Z79899 Other long term (current) drug therapy: Secondary | ICD-10-CM | POA: Diagnosis not present

## 2020-03-16 DIAGNOSIS — J029 Acute pharyngitis, unspecified: Secondary | ICD-10-CM | POA: Diagnosis not present

## 2020-03-16 DIAGNOSIS — R221 Localized swelling, mass and lump, neck: Secondary | ICD-10-CM | POA: Diagnosis not present

## 2020-03-16 LAB — BASIC METABOLIC PANEL
Anion gap: 9 (ref 5–15)
BUN: 10 mg/dL (ref 6–20)
CO2: 26 mmol/L (ref 22–32)
Calcium: 8.9 mg/dL (ref 8.9–10.3)
Chloride: 106 mmol/L (ref 98–111)
Creatinine, Ser: 1.06 mg/dL — ABNORMAL HIGH (ref 0.44–1.00)
GFR calc Af Amer: >60 mL/min (ref >60)
GFR calc non Af Amer: >60 mL/min (ref >60)
Glucose, Bld: 123 mg/dL — ABNORMAL HIGH (ref 70–99)
Potassium: 3.7 mmol/L (ref 3.5–5.1)
Sodium: 141 mmol/L (ref 135–145)

## 2020-03-16 LAB — CBC WITH DIFFERENTIAL/PLATELET
Abs Immature Granulocytes: 0.01 10*3/uL (ref 0.00–0.07)
Basophils Absolute: 0 10*3/uL (ref 0.0–0.1)
Basophils Relative: 0 %
Eosinophils Absolute: 0.3 10*3/uL (ref 0.0–0.5)
Eosinophils Relative: 4 %
HCT: 38.3 % (ref 36.0–46.0)
Hemoglobin: 12.1 g/dL (ref 12.0–15.0)
Immature Granulocytes: 0 %
Lymphocytes Relative: 29 %
Lymphs Abs: 1.9 10*3/uL (ref 0.7–4.0)
MCH: 30.3 pg (ref 26.0–34.0)
MCHC: 31.6 g/dL (ref 30.0–36.0)
MCV: 96 fL (ref 80.0–100.0)
Monocytes Absolute: 0.6 10*3/uL (ref 0.1–1.0)
Monocytes Relative: 10 %
Neutro Abs: 3.7 10*3/uL (ref 1.7–7.7)
Neutrophils Relative %: 57 %
Platelets: 248 10*3/uL (ref 150–400)
RBC: 3.99 MIL/uL (ref 3.87–5.11)
RDW: 13.3 % (ref 11.5–15.5)
WBC: 6.5 10*3/uL (ref 4.0–10.5)
nRBC: 0 % (ref 0.0–0.2)

## 2020-03-16 LAB — I-STAT BETA HCG BLOOD, ED (MC, WL, AP ONLY): I-stat hCG, quantitative: <5 m[IU]/mL (ref <5)

## 2020-03-16 LAB — GROUP A STREP BY PCR: Group A Strep by PCR: NOT DETECTED

## 2020-03-16 MED ORDER — HYDROCORTISONE 2.5 % EX LOTN: TOPICAL_LOTION | Freq: Two times a day (BID) | CUTANEOUS | 0 refills | Status: DC

## 2020-03-16 MED ORDER — DEXAMETHASONE SODIUM PHOSPHATE 10 MG/ML IJ SOLN
10.0000 mg | Freq: Once | INTRAMUSCULAR | Status: AC
  Administered 2020-03-16: 10 mg via INTRAVENOUS
  Filled 2020-03-16: qty 1

## 2020-03-16 MED ORDER — VANCOMYCIN HCL 1.5 G IV SOLR
1500.0000 mg | Freq: Once | INTRAVENOUS | Status: DC
  Filled 2020-03-16: qty 1500

## 2020-03-16 MED ORDER — SODIUM CHLORIDE 0.9 % IV SOLN: 1.0000 g | Freq: Once | INTRAVENOUS | Status: DC

## 2020-03-16 MED ORDER — KETOROLAC TROMETHAMINE 30 MG/ML IJ SOLN
30.0000 mg | Freq: Once | INTRAMUSCULAR | Status: AC
  Administered 2020-03-16: 30 mg via INTRAVENOUS
  Filled 2020-03-16: qty 1

## 2020-03-16 NOTE — ED Provider Notes (Signed)
MSE was initiated and I personally evaluated the patient and placed orders (if any) at  4:17 AM on March 16, 2020.  The patient appears stable so that the remainder of the MSE may be completed by another provider.  44 year old female who presents to the emergency department with a generalized rash to the bilateral arms, face, and upper back that began 2 days ago.  Rash is pruritic.  She attempted to use over-the-counter corticosteroids with improvement of itching.  No change in coloration.  Rash has not been worsening since onset.  She reports that she developed a sore throat yesterday.  She works night shift and awoke at approximately 02:30 with significantly worsening sore throat.  She states that her throat feels swollen and she is somewhat short of breath.  She has been able to swallow liquids since symptoms worsened.  No treatment prior to arrival.  No fever, chills, cough, chest pain, headache, sinus pain or pressure, drooling, trismus.  On exam, uvula is edematous.  Posterior oropharynx is not fully visualized.  She has right-sided anterior cervical lymphadenopathy.  No posterior cervical lymphadenopathy.  There is no trismus.  She is tolerating secretions without difficulty.  No stridor.  Lungs are clear to auscultation bilaterally.  No increased work of breathing or respiratory distress.  Heart is regular rate and rhythm.  There is a diffuse maculopapular rash noted to the bilateral arms, upper back, and face.  The bilateral palms are spared.  Given her age, she has not been vaccinated for each flu.  There is concern for epiglottitis given rapidly worsening sore throat.  She is not tripoding and is tolerating secretions at this time.  Will order soft tissue x-ray of the neck.  Given her diffuse sandpaper rash, there is concern for streptococcal infection.  Will order strep PCR and basic labs.   Frederik Pear A, PA-C 03/16/20 0441    Ward, Layla Maw, DO 03/16/20 [phone]

## 2020-03-16 NOTE — Discharge Instructions (Addendum)
Thank you for allowing me to care for you today in the Emergency Department.   You were seen today for sore throat and a rash.  Your sore throat appears to be from swelling of the uvula, which is called uvulitis.  This is likely due to a virus.  This should resolve on its own.  You can take 600 mg of ibuprofen with food once every 6 hours as needed for pain or swelling.  Additional instructions on interesting your pain and swelling are attached along with these discharge instructions.  If your symptoms do not significantly improve within the next 45 days, please follow-up with ENT.  Their information is listed above.  For the rash, you can use the hydrocortisone 2.5% lotion on your face 1-2 times daily.  Do not use this more than 2 weeks.  If the rash does not significantly improve in that time, please follow-up with primary care.  Return to the emergency department if you develop significantly worsening swelling to your neck, shortness of breath, respiratory distress, if you pass out, if you develop drooling, if you become unable to swallow your own saliva, or other new, concerning symptoms.

## 2020-03-16 NOTE — ED Triage Notes (Signed)
Pt states that she broke out in hives a couple days ago and are starting to get better with benadryl, pt woke up tonight and feels that he throat is swelling and it is difficult to swallow, denies SOB, some swelling to back of throat noted

## 2020-03-16 NOTE — ED Notes (Signed)
Pt tolerating PO fluids

## 2020-03-16 NOTE — ED Provider Notes (Signed)
[facility] EMERGENCY DEPARTMENT Provider Note   CSN: [id number] Arrival date & time: 03/16/20  0347     History Chief Complaint  Patient presents with  . Allergic Reaction    Julie Harrington is a 44 y.o. female who presents to the emergency department with a generalized rash to the bilateral arms, face, and upper back that began 2 days ago.  Rash is pruritic.  She attempted to use over-the-counter corticosteroids for eczema with improvement of itching. Rashes appeared unchanged since onset.  Denies recent soaps, lotions, hygiene products.  No new medications.  No contacts with similar rash.  She reports that she developed a sore throat yesterday.  She works night shift and awoke at approximately 02:30 with significantly worsening sore throat.    Initially, she was feeling short of breath and felt as if her throat was swollen.  She has been able to swallow liquids since symptoms worsened when she awoke.    She reports that her voice is gradually started to sound more muffled since she awoke.  No treatment prior to arrival.  No fever, chills, cough, chest pain, headache, sinus pain or pressure, drooling, trismus, dental pain, nasal congestion, rhinorrhea, loss of sense of taste or smell, neck pain or stiffness, or visual changes.  No history of similar.  No known sick contacts.  She is a current, every day smoker.  The history is provided by the patient. No language interpreter was used.       Past Medical History:  Diagnosis Date  . Depression   . Eczema     There are no problems to display for this patient.   Past Surgical History:  Procedure Laterality Date  . CHOLECYSTECTOMY       OB History   No obstetric history on file.     Family History  Problem Relation Age of Onset  . Hypertension Other   . Diabetes Other     Social History   Tobacco Use  . Smoking status: Current Every Day Smoker  . Smokeless tobacco: Never Used  Substance Use Topics  .  Alcohol use: Yes  . Drug use: No    Home Medications Prior to Admission medications   Medication Sig Start Date End Date Taking? Authorizing Provider  amLODipine (NORVASC) 5 MG tablet Take 5 mg by mouth daily.    [provider]  buPROPion (WELLBUTRIN SR) 150 MG 12 hr tablet Take 150 mg by mouth 2 (two) times daily.    [provider]  cephALEXin (KEFLEX) 500 MG capsule Take 1 capsule (500 mg total) by mouth 4 (four) times daily. 11/28/18   Fayrene Helper, PA-C  cyclobenzaprine (FLEXERIL) 10 MG tablet Take 1 tablet (10 mg total) by mouth at bedtime as needed for muscle spasms. 06/02/15   Charm Rings, MD  diclofenac (CATAFLAM) 50 MG tablet Take 1 tablet (50 mg total) by mouth 2 (two) times daily. Prn back pain. Take with food. 06/02/15   Charm Rings, MD  diclofenac (VOLTAREN) 75 MG EC tablet Take 1 tablet (75 mg total) by mouth 2 (two) times daily. 09/22/15   Ozella Rocks, MD  halobetasol (ULTRAVATE) 0.05 % ointment Apply topically daily. 02/24/20   Janalyn Harder, MD  hydrochlorothiazide (MICROZIDE) 12.5 MG capsule Take 12.5 mg by mouth daily.    [provider]  hydrocortisone 2.5 % lotion Apply topically 2 (two) times daily. 03/16/20   Lavere Stork A, PA-C  risperiDONE (RISPERDAL) 0.25 MG tablet Take 6  mg by mouth at bedtime.    [provider]  sertraline (ZOLOFT) 100 MG tablet Take 100 mg by mouth daily.    [provider]  triamcinolone cream (KENALOG) 0.1 % Apply 1 application topically 2 (two) times daily. 11/28/18   Fayrene Helper, PA-C    Allergies    Patient has no known allergies.  Review of Systems   Review of Systems  Constitutional: Negative for activity change, chills, fatigue and fever.  HENT: Positive for sore throat, trouble swallowing and voice change. Negative for congestion, dental problem, drooling, facial swelling, mouth sores, nosebleeds, postnasal drip, sinus pressure, sinus pain and tinnitus.   Respiratory: Negative for  cough, shortness of breath and wheezing.   Cardiovascular: Negative for chest pain, palpitations and leg swelling.  Gastrointestinal: Negative for abdominal pain.  Genitourinary: Negative for dysuria.  Musculoskeletal: Negative for back pain.  Skin: Positive for rash. Negative for color change and wound.  Allergic/Immunologic: Negative for immunocompromised state.  Neurological: Negative for weakness and headaches.  Psychiatric/Behavioral: Negative for confusion.    Physical Exam Updated Vital Signs BP 130/89   Pulse (!) 50   Temp 98 F (36.7 C) (Oral)   Resp 14   SpO2 99%   Physical Exam Vitals and nursing note reviewed.  Constitutional:      General: She is not in acute distress.    Appearance: She is not ill-appearing, toxic-appearing or diaphoretic.  HENT:     Head: Normocephalic.     Mouth/Throat:     Mouth: Mucous membranes are moist.     Palate: No mass and lesions.     Pharynx: Uvula midline. Uvula swelling present. No oropharyngeal exudate or posterior oropharyngeal erythema.     Tonsils: 1+ on the right. 1+ on the left.     Comments: Uvula is edematous, but midline.  Unable to fully visualize the posterior oropharynx.  There is no tripoding.  No trismus.  No drooling.  Patient is tolerating secretions without difficulty.  No sublingual edema or induration. Eyes:     Conjunctiva/sclera: Conjunctivae normal.  Neck:     Comments: Right-sided anterior cervical lymphadenopathy.  No left-sided anterior cervical lymphadenopathy.  No posterior cervical lymphadenopathy.  No meningismus. Cardiovascular:     Rate and Rhythm: Normal rate and regular rhythm.     Heart sounds: No murmur. No friction rub. No gallop.   Pulmonary:     Effort: Pulmonary effort is normal. No respiratory distress.     Breath sounds: No stridor. No wheezing, rhonchi or rales.     Comments: Lungs are clear to auscultation bilaterally.  No increased work of breathing.  Patient is able to speak in  complete, fluent sentences without increased work of breathing.  There is no stridor. Chest:     Chest wall: No tenderness.  Abdominal:     General: There is no distension.     Palpations: Abdomen is soft.  Musculoskeletal:     Cervical back: Neck supple.  Skin:    General: Skin is warm.     Findings: No rash.     Comments: Generalized maculopapular rash noted to the bilateral arms, face, and upper back.  The legs, anterior chest, abdomen, and lower back are spared.  No vesicles, desquamation, pustules or scaling. No overlying erythema or warmth.  No fluctuance or induration.  Neurological:     Mental Status: She is alert.  Psychiatric:        Behavior: Behavior normal.     ED Results /  Procedures / Treatments   Labs (all labs ordered are listed, but only abnormal results are displayed) Labs Reviewed  BASIC METABOLIC PANEL - Abnormal; Notable for the following components:      Result Value   Glucose, Bld 123 (*)    Creatinine, Ser 1.06 (*)    All other components within normal limits  GROUP A STREP BY PCR  CBC WITH DIFFERENTIAL/PLATELET  I-STAT BETA HCG BLOOD, ED ([facility], [facility], [facility] ONLY)    EKG None  Radiology DG Neck Soft Tissue  Result Date: 03/16/2020 CLINICAL DATA:  Recent hives with sore throat this morning and uvula swelling. EXAM: NECK SOFT TISSUES - 1+ VIEW COMPARISON:  None. FINDINGS: Possible lower epiglottis thickening, but this could be related to coaptation. No retropharyngeal soft tissue thickening. The larynx appears unremarkable. No osseous findings. IMPRESSION: 1. Epiglottis thickening versus coaptation with the tongue base. 2. The airway remains patent. Electronically Signed   By: Monte Fantasia M.D.   On: 03/16/2020 05:24    Procedures Procedures (including critical care time)  Medications Ordered in ED Medications  dexamethasone (DECADRON) injection 10 mg (10 mg Intravenous Given 03/16/20 0609)  ketorolac (TORADOL) 30 MG/ML injection 30 mg (30 mg Intravenous  Given 03/16/20 0630)    ED Course  I have reviewed the triage vital signs and the nursing notes.  Pertinent labs & imaging results that were available during my care of the patient were reviewed by me and considered in my medical decision making (see chart for details).    MDM Rules/Calculators/A&P                      44 year old female with a history of eczema presenting with generalized rash to the bilateral arms, upper back, and face that began 2 days ago.  The patient then awoke prior to arrival with sore throat accompanied by rapidly worsening swelling and shortness of breath.  The patient was seen and fully evaluated by Dr. Leonides Schanz, attending physician.  On exam, she has no shortness of breath.  Lungs are clear.  There is no stridor.  Uvula is edematous, but midline.  She is tolerating secretions without difficulty and there is no trismus.  No tripoding.  Unable to fully visualize the posterior oropharynx.  Given edematous uvula, I am concerned for uvulitis versus epiglottitis, especially given rapid onset of symptoms.  Decadron ordered.  X-ray of the soft tissue of the neck with concern for epiglottis thickening versus coaptation with the tongue base.  However, patient's labs are unremarkable.  She has no leukocytosis.  No left shift.  Metabolic panel is unremarkable.  On reevaluation, she reports marked improvement after receiving Decadron.  She has been successfully fluid challenged.  Vital signs remain normal.  Initially considered ordering blood cultures, giving antibiotics, and ordering CT soft tissue of the neck, but after discussing patient with Dr. Leonides Schanz and seeing marked improvement since the patient arrived in the ER, suspect uvulitis, likely secondary to viral source.  She was also given a dose of Toradol for inflammation in the ER.  I think given how well-appearing she is that it is reasonable to discharge her to home with symptomatic treatments and referral to ENT if symptoms do not  resolve in the next 4 to 5 days.  Will also give her strict return precautions to the ER for new or worsening symptoms.  Given the patient's rash there is initial concern for scarlatiniform rash.  Strep PCR was ordered and was negative.  On reevaluation after discussing the patient with Dr. Elesa Massed, rash appears more concerning for contact dermatitis.  She has had a recent exposures or hygiene products.  She has been using topical corticosteroids with improvement of symptoms on the back and arms.  Will give the patient a low potency corticosteroid to use on the face.  She was advised not to use this longer for 2 weeks and was given precautions for using it prior to going out in direct sunlight.  Doubt HSV, SJS, TENS, shingles, or cellulitis.  Doubt PTA, retropharyngeal abscess, Ludwig's angina, epiglottitis, mononucleosis, streptococcal pharyngitis, anaphylaxis, or allergic reaction.  The patient is hemodynamically stable and in no acute distress.  Safe for discharge home with outpatient follow-up as needed.  Final Clinical Impression(s) / ED Diagnoses Final diagnoses:  Uvulitis  Contact dermatitis, unspecified contact dermatitis type, unspecified trigger    Rx / DC Orders ED Discharge Orders         Ordered    hydrocortisone 2.5 % lotion  2 times daily     03/16/20 0700           Pegge Cumberledge A, PA-C 03/16/20 0831    Ward, Layla Maw, DO 03/18/20 2341

## 2020-03-19 ENCOUNTER — Encounter (HOSPITAL_COMMUNITY): Payer: Self-pay | Admitting: Emergency Medicine

## 2020-03-19 ENCOUNTER — Emergency Department (HOSPITAL_COMMUNITY)
Admission: EM | Admit: 2020-03-19 | Discharge: 2020-03-20 | Disposition: A | Discharge status: Home / Self Care | Payer: BC Managed Care – PPO | Attending: Emergency Medicine | Admitting: Emergency Medicine

## 2020-03-19 ENCOUNTER — Other Ambulatory Visit: Payer: Self-pay

## 2020-03-19 DIAGNOSIS — K122 Cellulitis and abscess of mouth: Secondary | ICD-10-CM | POA: Insufficient documentation

## 2020-03-19 DIAGNOSIS — Z79899 Other long term (current) drug therapy: Secondary | ICD-10-CM | POA: Insufficient documentation

## 2020-03-19 DIAGNOSIS — F17210 Nicotine dependence, cigarettes, uncomplicated: Secondary | ICD-10-CM | POA: Diagnosis not present

## 2020-03-19 DIAGNOSIS — R21 Rash and other nonspecific skin eruption: Secondary | ICD-10-CM | POA: Insufficient documentation

## 2020-03-19 LAB — I-STAT CHEM 8, ED
BUN: 16 mg/dL (ref 6–20)
Calcium, Ion: 1.32 mmol/L (ref 1.15–1.40)
Chloride: 107 mmol/L (ref 98–111)
Creatinine, Ser: 1.2 mg/dL — ABNORMAL HIGH (ref 0.44–1.00)
Glucose, Bld: 106 mg/dL — ABNORMAL HIGH (ref 70–99)
HCT: 39 % (ref 36.0–46.0)
Hemoglobin: 13.3 g/dL (ref 12.0–15.0)
Potassium: 3.7 mmol/L (ref 3.5–5.1)
Sodium: 145 mmol/L (ref 135–145)
TCO2: 27 mmol/L (ref 22–32)

## 2020-03-19 MED ORDER — DEXAMETHASONE SODIUM PHOSPHATE 10 MG/ML IJ SOLN
10.0000 mg | Freq: Once | INTRAMUSCULAR | Status: AC
  Administered 2020-03-19: 10 mg via INTRAVENOUS
  Filled 2020-03-19: qty 1

## 2020-03-19 NOTE — ED Triage Notes (Signed)
Patient reports persistent itchy rashes at arms /face and neck onset last week , respirations unlabored / airway intact , denies fever or chills .

## 2020-03-19 NOTE — ED Provider Notes (Signed)
[facility] EMERGENCY DEPARTMENT Provider Note   CSN: [id number] Arrival date & time: 03/19/20  2010     History Chief Complaint  Patient presents with  . Rash    Julie Harrington is a 44 y.o. female with a past medical history of eczema that presents to the emergency department for rash and sore throat.  She was seen in the emergency department here 2 days ago for similar complaints and assessed by Dr. Leonides Schanz and PA-C Joline Maxcy who diagnosed her with a contact dermatitis and uvulitis.  Blood work at the last visit was unremarkable with no leukocytosis.  She was discharged home with hydrocortisone 2.5% and states that this has slightly been helping, but the rash has not improved.    She returns today for worsening symptoms of her rash and throat.  Her rash is pruritic and bilaterally on her arms, chest, and face. She still denies any new soaps, lotions, hygiene products, new medications.  She also states that she has a sore throat and feels as if something is stuck in there, but denies any, cough, fever, eye pain, eye discharge, ear pain, shortness of breath, chest pain, sinus pain or pressure, neck pain, headache.  She denies any sick contacts.  She also states that she is extremely congested and is finding it difficult to breathe through her nose.  She denies any seasonal allergies and has tried sinus relief which has not helped.  She states that she is eating and drinking without pain, but is scared to swallow due to her congestion in her nose.  She has an appointment with her PCP on the 19th.  lHPI     Past Medical History:  Diagnosis Date  . Depression   . Eczema     There are no problems to display for this patient.   Past Surgical History:  Procedure Laterality Date  . CHOLECYSTECTOMY       OB History   No obstetric history on file.     Family History  Problem Relation Age of Onset  . Hypertension Other   . Diabetes Other     Social History   Tobacco  Use  . Smoking status: Current Every Day Smoker  . Smokeless tobacco: Never Used  Substance Use Topics  . Alcohol use: Yes  . Drug use: No    Home Medications Prior to Admission medications   Medication Sig Start Date End Date Taking? Authorizing Provider  amLODipine (NORVASC) 5 MG tablet Take 5 mg by mouth daily.    [provider]  amoxicillin-clavulanate (AUGMENTIN) 875-125 MG tablet Take 1 tablet by mouth every 12 (twelve) hours. 03/20/20   Alfredia Client, PA-C  buPROPion (WELLBUTRIN SR) 150 MG 12 hr tablet Take 150 mg by mouth 2 (two) times daily.    [provider]  cephALEXin (KEFLEX) 500 MG capsule Take 1 capsule (500 mg total) by mouth 4 (four) times daily. 11/28/18   Domenic Moras, PA-C  cyclobenzaprine (FLEXERIL) 10 MG tablet Take 1 tablet (10 mg total) by mouth at bedtime as needed for muscle spasms. 06/02/15   Melony Overly, MD  diclofenac (CATAFLAM) 50 MG tablet Take 1 tablet (50 mg total) by mouth 2 (two) times daily. Prn back pain. Take with food. 06/02/15   Melony Overly, MD  diclofenac (VOLTAREN) 75 MG EC tablet Take 1 tablet (75 mg total) by mouth 2 (two) times daily. 09/22/15   Waldemar Dickens, MD  fluticasone Asencion Islam) 50 MCG/ACT nasal spray  Place 2 sprays into both nostrils daily. 03/20/20   Farrel Gordon, PA-C  halobetasol (ULTRAVATE) 0.05 % ointment Apply topically daily. 02/24/20   Janalyn Harder, MD  hydrochlorothiazide (MICROZIDE) 12.5 MG capsule Take 12.5 mg by mouth daily.    [provider]  hydrocortisone 2.5 % lotion Apply topically 2 (two) times daily. 03/16/20   McDonald, Mia A, PA-C  predniSONE (STERAPRED UNI-PAK 21 TAB) 10 MG (21) TBPK tablet Take by mouth daily. Take 6 tabs by mouth daily  for 2 days, then 5 tabs for 2 days, then 4 tabs for 2 days, then 3 tabs for 2 days, 2 tabs for 2 days, then 1 tab by mouth daily for 2 days 03/20/20   Farrel Gordon, PA-C  risperiDONE (RISPERDAL) 0.25 MG tablet Take 6 mg by mouth at bedtime.    [provider]  sertraline (ZOLOFT) 100 MG tablet Take 100 mg by mouth daily.    [provider]  triamcinolone cream (KENALOG) 0.1 % Apply 1 application topically 2 (two) times daily. 11/28/18   Fayrene Helper, PA-C    Allergies    Patient has no known allergies.  Review of Systems   Review of Systems  Constitutional: Negative for appetite change, chills, fatigue and fever.  HENT: Positive for congestion and sore throat. Negative for drooling, ear discharge, ear pain, facial swelling, hearing loss, mouth sores, nosebleeds, postnasal drip, rhinorrhea, sinus pressure, sinus pain, sneezing, tinnitus, trouble swallowing (feels as if something is stuck in her throat ) and voice change.   Eyes: Negative for pain, discharge, redness and itching.  Respiratory: Negative for cough, choking, chest tightness, shortness of breath, wheezing and stridor.   Gastrointestinal: Negative.   Musculoskeletal: Negative.   Skin: Positive for rash.  Allergic/Immunologic: Negative.   Neurological: Negative.   Hematological: Negative.   Psychiatric/Behavioral: Negative.     Physical Exam Updated Vital Signs BP 135/86 (BP Location: Left Arm)   Pulse 72   Temp 98 F (36.7 C) (Oral)   Resp 16   Ht 5\' 6"  (1.676 m)   Wt 90 kg   SpO2 99%   BMI 32.02 kg/m   Physical Exam Constitutional:      General: She is not in acute distress.    Appearance: Normal appearance. She is not ill-appearing, toxic-appearing or diaphoretic.  HENT:     Head: Normocephalic and atraumatic.     Nose: Congestion present. No rhinorrhea.     Mouth/Throat:     Mouth: Mucous membranes are moist.     Comments: Uvula looks inflamed without erythema or deviation, tonsils 1+ without exudate or erythema. Hard to visualize posterior oropharynx.  Eyes:     General: No scleral icterus.       Right eye: No discharge.        Left eye: No discharge.     Extraocular Movements: Extraocular movements intact.     Conjunctiva/sclera:  Conjunctivae normal.  Cardiovascular:     Rate and Rhythm: Normal rate and regular rhythm.     Pulses: Normal pulses.     Heart sounds: Normal heart sounds. No murmur. No gallop.   Pulmonary:     Effort: Pulmonary effort is normal. No respiratory distress.     Breath sounds: No stridor. No wheezing, rhonchi or rales.  Musculoskeletal:     Cervical back: Normal range of motion and neck supple. No rigidity.  Lymphadenopathy:     Cervical: Cervical adenopathy (right preauricular node which was mobile and tender) present.  Skin:  General: Skin is warm and dry.     Comments: Generalized maculopapular rash on bilateral arms, face, and upper anterior chest. No rash on palms or soles. No desquamation or strawberry tongue.   Neurological:     Mental Status: She is alert and oriented to person, place, and time.  Psychiatric:        Mood and Affect: Mood normal.        Thought Content: Thought content normal.        Judgment: Judgment normal.     ED Results / Procedures / Treatments   Labs (all labs ordered are listed, but only abnormal results are displayed) Labs Reviewed  I-STAT CHEM 8, ED - Abnormal; Notable for the following components:      Result Value   Creatinine, Ser 1.20 (*)    Glucose, Bld 106 (*)    All other components within normal limits    EKG None  Radiology No results found.  Procedures Procedures (including critical care time)  Medications Ordered in ED Medications  dexamethasone (DECADRON) injection 10 mg (10 mg Intravenous Given 03/19/20 2345)  sodium chloride 0.9 % bolus 1,000 mL (1,000 mLs Intravenous New Bag/Given 03/20/20 0030)  iohexol (OMNIPAQUE) 300 MG/ML solution 75 mL (75 mLs Intravenous Contrast Given 03/20/20 0021)    ED Course  I have reviewed the triage vital signs and the nursing notes.  Pertinent labs & imaging results that were available during my care of the patient were reviewed by me and considered in my medical decision making (see  chart for details).  Clinical Course as of Mar 20 156  Sat Mar 20, 2020  0010 Pt with papular raised rash on the face, neck, chest, arms and hands.  No rash to the palms or soles.  Pharynx with erythema and generalized edema.  No tonsillar swelling.  No swelling of the tonsillar pillars.  Uvula mildly edematous and erythematous but midline.  Patient handling secretions.  No muffled voice.  Records reviewed.  Patient was seen several days ago for similar.  She does report that symptoms have worsened since that time.  Will obtain CT neck with contrast to assess for possible retropharyngeal abscess.  If negative will treat for possible bacterial pharyngitis.  Strep test several days ago was negative.  Additionally will give Decadron here in the emergency room and steroid taper at discharge.  Discussed with Dr. Elesa Massed who is in agreement with the plan.   [HM]  0013 Elevated from previous several days ago.  Suspect decreased oral intake due to significant sore throat and nasal congestion.  Will give fluids here in the emergency department.  She is afebrile without tachycardia or hypotension.  No hypoxia.  No stridor at rest.  Creatinine(!): 1.20 [HM]    Clinical Course User Index [HM] Muthersbaugh, Boyd Kerbs   MDM Rules/Calculators/A&P                      11:30 Isley Weisheit is a 44 y.o. female with a past medical history of eczema that presents to the emergency department for rash and sore throat. On exam pt is not tripoding, no stridor or wheezing, no muffled voice, normal vitals, unable to fully visulize posterior oropharynx.  Will obtain  CT head today due to patient returning to the emergency department for worsening symptoms and to rule out retropharyngeal abscess, PTA, Ludwig's, epiglottitis.  Rash is not on palms and soles. Patient's rash does not look like shingles, cellulitis, SJS, TENS, HSV,  syphilis, RMSF. No strawberry tongue, no desquamation. Will give Decadron here in the emergency room.   01:00 Chem-8 shows that creatinine has increased to 1.2 from 1.06 in the last 3 days.  This is most likely due to decreased oral intake from sore throat.  Fluids were given.  01:57 Neck CT does not show any acute inflammatory changes or abnormality. Small nodules were found in her lungs and thyroid. I spoke to the patient about this and advised her to follow up with her PCP. Will discharge home with Prednisone taper, Flonase and Augmentin. Discussed side effects of medications and to return to the ER if she has an allergic reaction to any of them. Recommend ENT and dermatology follow up. Pt has PCP appointment scheduled next week. Pt is agreeable with plan. Gave her strong ED return precautions.Pt was also evaluated and seen by PA-C Dahlia Client Muthersbaugh who agrees with plan.  Final Clinical Impression(s) / ED Diagnoses Final diagnoses:  Rash  Uvulitis    Rx / DC Orders ED Discharge Orders         Ordered    predniSONE (STERAPRED UNI-PAK 21 TAB) 10 MG (21) TBPK tablet  Daily     03/20/20 0147    fluticasone (FLONASE) 50 MCG/ACT nasal spray  Daily     03/20/20 0147    amoxicillin-clavulanate (AUGMENTIN) 875-125 MG tablet  Every 12 hours     03/20/20 0147           Farrel Gordon, PA-C 03/20/20 0321    Arby Barrette, MD 03/29/20 0730

## 2020-03-20 ENCOUNTER — Emergency Department (HOSPITAL_COMMUNITY): Payer: BC Managed Care – PPO

## 2020-03-20 DIAGNOSIS — E041 Nontoxic single thyroid nodule: Secondary | ICD-10-CM | POA: Diagnosis not present

## 2020-03-20 MED ORDER — AMOXICILLIN-POT CLAVULANATE 875-125 MG PO TABS: 1.0000 | ORAL_TABLET | Freq: Two times a day (BID) | ORAL | 0 refills | Status: DC

## 2020-03-20 MED ORDER — PREDNISONE 10 MG (21) PO TBPK: ORAL_TABLET | Freq: Every day | ORAL | 0 refills | Status: DC

## 2020-03-20 MED ORDER — IOHEXOL 300 MG/ML  SOLN
75.0000 mL | Freq: Once | INTRAMUSCULAR | Status: AC | PRN
  Administered 2020-03-20: 75 mL via INTRAVENOUS

## 2020-03-20 MED ORDER — SODIUM CHLORIDE 0.9 % IV BOLUS
1000.0000 mL | Freq: Once | INTRAVENOUS | Status: AC
  Administered 2020-03-20: 1000 mL via INTRAVENOUS

## 2020-03-20 MED ORDER — FLUTICASONE PROPIONATE 50 MCG/ACT NA SUSP: 2.0000 | Freq: Every day | NASAL | 2 refills | Status: DC

## 2020-03-20 NOTE — ED Notes (Signed)
The pt feels no difference from the rash

## 2020-03-20 NOTE — Discharge Instructions (Addendum)
You were seen today for rash and sore throat. Your blood work and CT of your neck was normal. Small nodules were seen in your thyroid and lungs,  follow up with your PCP about this. Make sure you drink plenty of water. Take the prednisone as directed for your rash and sore throat. Use the nasal spray given for your congestion. Take the antibiotic as directed. Come back to the emergency room if you start developing reactions to any of the medications given. Come back to the ER for worsening symptoms, trouble breathing, worsening rash. Follow up with ENT and dermatology.

## 2020-03-26 ENCOUNTER — Emergency Department (HOSPITAL_COMMUNITY)
Admission: EM | Admit: 2020-03-26 | Discharge: 2020-03-26 | Disposition: A | Discharge status: Home / Self Care | Payer: BC Managed Care – PPO | Attending: Emergency Medicine | Admitting: Emergency Medicine

## 2020-03-26 ENCOUNTER — Encounter (HOSPITAL_COMMUNITY): Payer: Self-pay | Admitting: Emergency Medicine

## 2020-03-26 ENCOUNTER — Other Ambulatory Visit: Payer: Self-pay

## 2020-03-26 DIAGNOSIS — L309 Dermatitis, unspecified: Secondary | ICD-10-CM | POA: Diagnosis not present

## 2020-03-26 DIAGNOSIS — R21 Rash and other nonspecific skin eruption: Secondary | ICD-10-CM | POA: Diagnosis not present

## 2020-03-26 DIAGNOSIS — F17200 Nicotine dependence, unspecified, uncomplicated: Secondary | ICD-10-CM | POA: Insufficient documentation

## 2020-03-26 MED ORDER — CETIRIZINE HCL 10 MG PO TABS: 10.0000 mg | ORAL_TABLET | Freq: Every day | ORAL | 0 refills | Status: DC

## 2020-03-26 MED ORDER — HYDROXYZINE HCL 10 MG PO TABS: 10.0000 mg | ORAL_TABLET | Freq: Four times a day (QID) | ORAL | 0 refills | Status: DC | PRN

## 2020-03-26 NOTE — ED Triage Notes (Signed)
Patient states she was recently here for rash on face and arms. Returned due to appt with dermatologist not until Monday. Currently on prednisone and amoxicillin. Patient in NAD and maintaining airway.

## 2020-03-26 NOTE — Discharge Instructions (Signed)
Continue to use a cool washcloth to clean your skin, avoid soaps and detergents. Take prescriptions as previously prescribed.  You may also now take Atarax as needed as prescribed for itching.  Take Zyrtec daily.  Follow-up with dermatology as planned.

## 2020-03-26 NOTE — ED Provider Notes (Signed)
[facility] EMERGENCY DEPARTMENT Provider Note   CSN: [id number] Arrival date & time: 03/26/20  0240     History Chief Complaint  Patient presents with  . Rash    Julie Harrington is a 44 y.o. female.  44 year old female presents with complaint of rash to the face x2 weeks.  Patient reports no improvement with taking prednisone deviously prescribed.  Reports itching to same.  Denies new soaps, detergents, medications, denies oral lesions or skin peeling.  Patient is cleaning her face with a cool wash rag.  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  No other complaints or concerns today.          Past Medical History:  Diagnosis Date  . Depression   . Eczema     There are no problems to display for this patient.   Past Surgical History:  Procedure Laterality Date  . CHOLECYSTECTOMY       OB History   No obstetric history on file.     Family History  Problem Relation Age of Onset  . Hypertension Other   . Diabetes Other     Social History   Tobacco Use  . Smoking status: Current Every Day Smoker  . Smokeless tobacco: Never Used  Substance Use Topics  . Alcohol use: Yes  . Drug use: No    Home Medications Prior to Admission medications   Medication Sig Start Date End Date Taking? Authorizing Provider  amLODipine (NORVASC) 5 MG tablet Take 5 mg by mouth daily.    [provider]  amoxicillin-clavulanate (AUGMENTIN) 875-125 MG tablet Take 1 tablet by mouth every 12 (twelve) hours. 03/20/20   Farrel Gordon, PA-C  buPROPion (WELLBUTRIN SR) 150 MG 12 hr tablet Take 150 mg by mouth 2 (two) times daily.    [provider]  cetirizine (ZYRTEC ALLERGY) 10 MG tablet Take 1 tablet (10 mg total) by mouth daily. 03/26/20   Jeannie Fend, PA-C  cyclobenzaprine (FLEXERIL) 10 MG tablet Take 1 tablet (10 mg total) by mouth at bedtime as needed for muscle  spasms. 06/02/15   Charm Rings, MD  diclofenac (CATAFLAM) 50 MG tablet Take 1 tablet (50 mg total) by mouth 2 (two) times daily. Prn back pain. Take with food. 06/02/15   Charm Rings, MD  diclofenac (VOLTAREN) 75 MG EC tablet Take 1 tablet (75 mg total) by mouth 2 (two) times daily. 09/22/15   Ozella Rocks, MD  fluticasone (FLONASE) 50 MCG/ACT nasal spray Place 2 sprays into both nostrils daily. 03/20/20   Farrel Gordon, PA-C  halobetasol (ULTRAVATE) 0.05 % ointment Apply topically daily. 02/24/20   Janalyn Harder, MD  hydrochlorothiazide (MICROZIDE) 12.5 MG capsule Take 12.5 mg by mouth daily.    [provider]  hydrocortisone 2.5 % lotion Apply topically 2 (two) times daily. 03/16/20   McDonald, Mia A, PA-C  hydrOXYzine (ATARAX/VISTARIL) 10 MG tablet Take 1 tablet (10 mg total) by mouth every 6 (six) hours as needed for itching. 03/26/20   Jeannie Fend, PA-C  predniSONE (STERAPRED UNI-PAK 21 TAB) 10 MG (21) TBPK tablet Take by mouth daily. Take 6 tabs by mouth daily  for 2 days, then 5 tabs for 2 days, then 4 tabs for 2 days, then 3 tabs for 2 days, 2 tabs for 2 days, then 1 tab by mouth daily for 2 days 03/20/20  Farrel Gordon, PA-C  risperiDONE (RISPERDAL) 0.25 MG tablet Take 6 mg by mouth at bedtime.    [provider]  sertraline (ZOLOFT) 100 MG tablet Take 100 mg by mouth daily.    [provider]  triamcinolone cream (KENALOG) 0.1 % Apply 1 application topically 2 (two) times daily. 11/28/18   Fayrene Helper, PA-C    Allergies    Patient has no known allergies.  Review of Systems   Review of Systems  Constitutional: Negative for fever.  HENT: Negative for trouble swallowing and voice change.   Eyes: Negative for visual disturbance.  Musculoskeletal: Negative for arthralgias, joint swelling and myalgias.  Skin: Positive for rash.  Allergic/Immunologic: Negative for immunocompromised state.  Neurological: Negative for weakness and headaches.    Hematological: Negative for adenopathy.  All other systems reviewed and are negative.   Physical Exam Updated Vital Signs BP 114/81 (BP Location: Left Arm)   Pulse 89   Temp 97.9 F (36.6 C) (Oral)   Resp 17   SpO2 99%   Physical Exam Vitals and nursing note reviewed.  Constitutional:      General: She is not in acute distress.    Appearance: She is well-developed. She is not diaphoretic.  HENT:     Head: Normocephalic and atraumatic.  Pulmonary:     Effort: Pulmonary effort is normal.  Skin:    General: Skin is warm and dry.     Findings: Rash present. No erythema.     Comments: Papular rash to face extending into patches on upper chest.  Larger patch with scaling noted to left side neck just below ear.  Neurological:     Mental Status: She is alert and oriented to person, place, and time.  Psychiatric:        Behavior: Behavior normal.     ED Results / Procedures / Treatments   Labs (all labs ordered are listed, but only abnormal results are displayed) Labs Reviewed - No data to display  EKG None  Radiology No results found.  Procedures Procedures (including critical care time)  Medications Ordered in ED Medications - No data to display  ED Course  I have reviewed the triage vital signs and the nursing notes.  Pertinent labs & imaging results that were available during my care of the patient were reviewed by me and considered in my medical decision making (see chart for details).  Clinical Course as of Mar 27 1027  Fri Mar 26, 2020  [id number] 44 year old female with complaint of rash to the face and chest x2 weeks, not improving with prednisone, associated with itching.  On exam has papular rash to face with patches onto her neck and chest.  Larger patch with scaling noted to left side face, question viral origin such as pityriasis.  Patient be given Atarax for her itching, recommend daily Zyrtec.  Patient is scheduled to see dermatology in 3 days.  Given work  note through the weekend as wearing a mask on her face is very irritating due to her rash at this time.   [LM]    Clinical Course User Index [LM] Alden Hipp   MDM Rules/Calculators/A&P                      Final Clinical Impression(s) / ED Diagnoses Final diagnoses:  Rash    Rx / DC Orders ED Discharge Orders         Ordered    hydrOXYzine (ATARAX/VISTARIL) 10 MG  tablet  Every 6 hours PRN     03/26/20 0943    cetirizine (ZYRTEC ALLERGY) 10 MG tablet  Daily     03/26/20 0943           Tacy Learn, PA-C 03/26/20 1028    Quintella Reichert, MD 03/26/20 1032

## 2020-03-29 ENCOUNTER — Ambulatory Visit (INDEPENDENT_AMBULATORY_CARE_PROVIDER_SITE_OTHER): Payer: BC Managed Care – PPO | Admitting: Dermatology

## 2020-03-29 ENCOUNTER — Other Ambulatory Visit: Payer: Self-pay

## 2020-03-29 ENCOUNTER — Encounter: Payer: Self-pay | Admitting: Dermatology

## 2020-03-29 VITALS — BP 139/83 | Wt 249.0 lb

## 2020-03-29 DIAGNOSIS — L309 Dermatitis, unspecified: Secondary | ICD-10-CM

## 2020-03-29 MED ORDER — MINOCYCLINE HCL 50 MG PO CAPS: 50.0000 mg | ORAL_CAPSULE | Freq: Two times a day (BID) | ORAL | 0 refills | Status: DC

## 2020-03-29 NOTE — Patient Instructions (Signed)
Return to Work _____________[name]______________________________________ was treated at our facility. Injury or illness was: ___Work-related. ___Not work-related. x___Undetermined if work-related. Return to work  Employee may return to work on ______04/23/21________________.  Employee may return to modified work on ______________________. Work activity restrictions This person is not able to do the following activities: ___Bend ___Sit for a prolonged time  This person should not sit for more than ____ hours at a time.  This person should not sit for more than ____ hours during an 8-hour workday. ___Lift more than ________ lb ___Squat ___ Stand for a prolonged time  ___ This person should not stand for more than ____ hours at a time.  ___ This person should not stand for more than ____ hours during an 8-hour workday. ___Climb ___Reach ___Push and pull with the ___ right hand ___ left hand ___ Walk  ___ This person should not walk for more than ____ hours at a time.  ___ This person should not walk for more than ____ hours during an 8-hour workday. ___ Drive or operate a motor vehicle at work ___ [employer] with the ___ right hand ___ left hand ___Other _________________________________________________________________ These restrictions are effective until ______________________ or until a recheck appointment on ______________________. Health care provider name (printed): ___Stuart Tafeen______________________________________ Health care provider (signature): _________________________________________ Date: ___04/19/21______________________________________ How to use this form Show this Return to Work statement to your supervisor at work as soon as possible. Your employer should be aware of your condition and may be able to help with the necessary work activity restrictions. Contact your health care provider if:  You wish to return to work sooner than the date that is listed  above.  You have problems that make it difficult for you to return at that time. This information is not intended to replace advice given to you by your health care provider. Make sure you discuss any questions you have with your health care provider. Document Revised: 11/22/2017 Document Reviewed: 11/22/2017 Elsevier Patient Education  2020 [employer].

## 2020-03-29 NOTE — Progress Notes (Signed)
   Follow-Up Visit   Subjective  Julie Harrington is a 44 y.o. female who presents for the following: Rash (Across face, arms/hands and upper body.  Very itchy and it seems worse at night because she has an itching in her throat.  Difficult to sleep.  No changes in household products or medications. Nobody else in the house itching but she does have a dog.  Went to the ER twice where they gave her IV steroids, Prednisone pack and antibiotics for her throat.  Was also given Halobetasol and hydrocortisone ointment. Not helpful at all.  ).  rash Location: Everywhere, worst on hands arms face neck back Duration: Several weeks Quality: Spreading Associated Signs/Symptoms: Severe itch Modifying Factors: 2 visits to emergency room who gave cortisone pills and shots and took her out of work Severity: 9 out of 10 Timing: Context:   The following portions of the chart were reviewed this encounter and updated as appropriate: Tobacco  Allergies  Meds  Problems  Med Hx  Surg Hx  Fam Hx      Objective  Well appearing patient in no apparent distress; mood and affect are within normal limits.  A focused examination was performed including face, neck, chest and back. Relevant physical exam findings are noted in the Assessment and Plan. Also arms, nail beds, oral mucosa examined. Severe flare of intensely itchy breaking out for the past 2weeks.  She was given both parenteral and oral steroids with little improvement, but the oral was a low dose dose pack which Joletta did not fully take.  Examination showed markedly lichenoid patches covering the lower face and neck with some more infiltrated patches below the left eye which she feels is secondary to rubbing.  More follicular lichenoid patches across the back and the arms.  The dorsal hands show more eczematous dermatitis.  The differential here includes contact dermatitis, lichen planus, lupus, and even sarcoidosis.  Because she was recently on prednisone I  will defer doing laboratory tests, but I have asked her to get these done in 1 week.  Next week she will obtain CBC with differential, repeat c-Met, sed rate, and ANA.  The interim to try and calm things down without going back to steroids, we will begin her on minocycline 50 mg twice daily and she may use an over-the-counter antiitch lotion with from oxygen as much as she would like.  And on follow-up Thursday next week.  Assessment & Plan

## 2020-04-01 ENCOUNTER — Telehealth: Payer: Self-pay | Admitting: Dermatology

## 2020-04-01 NOTE — Telephone Encounter (Signed)
Phone call to patient to let her know she can go to [facility] either on Friday or Monday morning before her appointment with Dr. Jorja Loa.  Voicemail left for patient to give the office a call back.

## 2020-04-01 NOTE — Telephone Encounter (Signed)
Has 9:15 appt. w/ST Monday & was told to go to [facility]. Wants to know when she should do that

## 2020-04-01 NOTE — Telephone Encounter (Signed)
Patient aware to go to lab Friday or Monday

## 2020-04-02 DIAGNOSIS — L309 Dermatitis, unspecified: Secondary | ICD-10-CM | POA: Diagnosis not present

## 2020-04-04 LAB — CBC WITH DIFFERENTIAL/PLATELET
Absolute Monocytes: 929 cells/uL (ref 200–950)
Basophils Absolute: 22 cells/uL (ref 0–200)
Basophils Relative: 0.2 %
Eosinophils Absolute: 248 cells/uL (ref 15–500)
Eosinophils Relative: 2.3 %
HCT: 38.4 % (ref 35.0–45.0)
Hemoglobin: 12.5 g/dL (ref 11.7–15.5)
Lymphs Abs: 1652 cells/uL (ref 850–3900)
MCH: 30.1 pg (ref 27.0–33.0)
MCHC: 32.6 g/dL (ref 32.0–36.0)
MCV: 92.5 fL (ref 80.0–100.0)
MPV: 10.8 fL (ref 7.5–12.5)
Monocytes Relative: 8.6 %
Neutro Abs: 7949 cells/uL — ABNORMAL HIGH (ref 1500–7800)
Neutrophils Relative %: 73.6 %
Platelets: 225 10*3/uL (ref 140–400)
RBC: 4.15 10*6/uL (ref 3.80–5.10)
RDW: 13.1 % (ref 11.0–15.0)
Total Lymphocyte: 15.3 %
WBC: 10.8 10*3/uL (ref 3.8–10.8)

## 2020-04-04 LAB — COMPREHENSIVE METABOLIC PANEL
AG Ratio: 1.8 (calc) (ref 1.0–2.5)
ALT: 64 U/L — ABNORMAL HIGH (ref 6–29)
AST: 28 U/L (ref 10–30)
Albumin: 4.2 g/dL (ref 3.6–5.1)
Alkaline phosphatase (APISO): 67 U/L (ref 31–125)
BUN: 10 mg/dL (ref 7–25)
CO2: 31 mmol/L (ref 20–32)
Calcium: 9.7 mg/dL (ref 8.6–10.2)
Chloride: 104 mmol/L (ref 98–110)
Creat: 1.08 mg/dL (ref 0.50–1.10)
Globulin: 2.4 g/dL (calc) (ref 1.9–3.7)
Glucose, Bld: 100 mg/dL (ref 65–139)
Potassium: 3.8 mmol/L (ref 3.5–5.3)
Sodium: 143 mmol/L (ref 135–146)
Total Bilirubin: 0.6 mg/dL (ref 0.2–1.2)
Total Protein: 6.6 g/dL (ref 6.1–8.1)

## 2020-04-04 LAB — ANA: Anti Nuclear Antibody (ANA): NEGATIVE

## 2020-04-04 LAB — SED RATE MANUAL WEST RFLX: SED RATE BY MODIFIED WESTERGREN,MANUAL: 15 mm/h (ref 0–20)

## 2020-04-04 LAB — SEDIMENTATION RATE

## 2020-04-05 ENCOUNTER — Ambulatory Visit (INDEPENDENT_AMBULATORY_CARE_PROVIDER_SITE_OTHER): Payer: BC Managed Care – PPO | Admitting: Dermatology

## 2020-04-05 ENCOUNTER — Encounter: Payer: Self-pay | Admitting: Dermatology

## 2020-04-05 ENCOUNTER — Telehealth: Payer: Self-pay | Admitting: Dermatology

## 2020-04-05 ENCOUNTER — Other Ambulatory Visit: Payer: Self-pay

## 2020-04-05 DIAGNOSIS — L439 Lichen planus, unspecified: Secondary | ICD-10-CM | POA: Diagnosis not present

## 2020-04-05 MED ORDER — MINOCYCLINE HCL 50 MG PO CAPS: 50.0000 mg | ORAL_CAPSULE | Freq: Two times a day (BID) | ORAL | 2 refills | Status: DC

## 2020-04-05 MED ORDER — HYDROCORTISONE 2.5 % EX CREA: TOPICAL_CREAM | Freq: Two times a day (BID) | CUTANEOUS | 3 refills | Status: DC

## 2020-04-05 MED ORDER — METRONIDAZOLE 250 MG PO TABS: 250.0000 mg | ORAL_TABLET | Freq: Two times a day (BID) | ORAL | 2 refills | Status: DC

## 2020-04-05 NOTE — Telephone Encounter (Signed)
I did no testing that indicates she is allergic to latex. 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

## 2020-04-05 NOTE — Patient Instructions (Addendum)
Advised her to continue the [employer] Anti-Itch. If the oral minocycline improvement does not continue, switch to oral metronidazole 250mg , one twice daily with food, no alcohol; warned of GI upset. Defer patch testing/biopy since improved.

## 2020-04-05 NOTE — Telephone Encounter (Signed)
Phone call to patient to tell her that Dr. Jorja Loa would like her to continue the [facility].  Told her that refills were sent to the pharmacy and that they know not to fill the Metronidazole. Also, told her that Dr. Jorja Loa said that he did not do any testing to indicate she is allergic to latex and most hospitals are latex free now.

## 2020-04-05 NOTE — Telephone Encounter (Signed)
Needs note for work stating that she needs special gloves that aren't latex. She can pick it up.

## 2020-04-06 ENCOUNTER — Encounter: Payer: Self-pay | Admitting: Dermatology

## 2020-04-06 NOTE — Progress Notes (Signed)
   Follow-Up Visit   Subjective  Julie Harrington is a 44 y.o. female who presents for the following: Follow-up (Feels like she is doing a little bit better.  Itching has improved a lot.  Keeping her face hydrated with Vaseline.).  rash Location: All over Duration: Months Quality: Proved Associated Signs/Symptoms: Itching Modifying Factors: Oral minocycline Severity:  Timing: Context:   The following portions of the chart were reviewed this encounter and updated as appropriate:     Objective  Well appearing patient in no apparent distress; mood and affect are within normal limits.  A focused examination was performed including head, neck, arms, back, abdomen.. Relevant physical exam findings are noted in the Assessment and Plan.   Assessment & Plan  Lichen planus (4) Left Dorsal Hand; Right Dorsal Hand; Left Buccal Cheek ; Right Buccal Cheek   Ordered Medications: hydrocortisone 2.5 % cream  Reordered Medications minocycline (MINOCIN) 50 MG capsule

## 2020-04-07 ENCOUNTER — Telehealth: Payer: Self-pay | Admitting: Dermatology

## 2020-04-07 NOTE — Telephone Encounter (Signed)
Patient calling to ask about rx to lighten dark spots. She was seen at our office on Monday and only got 2 medication from the pharmacy. She uses [facility] on [address].

## 2020-04-08 NOTE — Telephone Encounter (Signed)
Phone call to patient to give her Dr. Tafeen's recommendations.  Patient aware. 

## 2020-04-08 NOTE — Telephone Encounter (Signed)
Must first try to stop active rash/itch before thinking about products to blend skin color. If rash controlled and dark spots haven't faded by the fall, that's when we'll discuss adding Rx to try to lighten spots.

## 2020-04-09 ENCOUNTER — Telehealth: Payer: Self-pay | Admitting: Dermatology

## 2020-04-09 NOTE — Telephone Encounter (Signed)
Phone call to patient to tell her to stop the metronidazole per ST and to wait a week. If her mouth is better then start using the Minocycline again. Told patient if she needs refills on the Minocycline then call us and we will refill.

## 2020-04-09 NOTE — Telephone Encounter (Signed)
Seen Monday. Spots on tongue + inside of lip, which is dry. On metronidozole. Is this from med or is it eczema . What can she do?

## 2020-04-09 NOTE — Telephone Encounter (Signed)
Stop the metronidazole. Wait one week, if mouth better, go back to the minocycline which is, I believe, what she was advised to continue at her visit.

## 2020-05-17 ENCOUNTER — Encounter: Payer: Self-pay | Admitting: Dermatology

## 2020-05-17 ENCOUNTER — Other Ambulatory Visit: Payer: Self-pay

## 2020-05-17 ENCOUNTER — Ambulatory Visit (INDEPENDENT_AMBULATORY_CARE_PROVIDER_SITE_OTHER): Payer: BC Managed Care – PPO | Admitting: Dermatology

## 2020-05-17 ENCOUNTER — Telehealth: Payer: Self-pay

## 2020-05-17 DIAGNOSIS — L439 Lichen planus, unspecified: Secondary | ICD-10-CM | POA: Diagnosis not present

## 2020-05-17 MED ORDER — HALCINONIDE 0.1 % EX CREA: 1.0000 "application " | TOPICAL_CREAM | Freq: Once | CUTANEOUS | 2 refills | Status: AC

## 2020-05-17 NOTE — Progress Notes (Signed)
   Follow-Up Visit   Subjective  Julie Harrington is a 44 y.o. female who presents for the following: Follow-up (itching- MCN 50mg  bid- not much help & tx Ultravate cream- clears up when I  use this).  Rash Location: Worst on lower torso and arms Duration: Months Quality: Severe itching Associated Signs/Symptoms: Modifying Factors: Oral minocycline and metronidazole, topical clobetasol Severity:  Timing: Context:   The following portions of the chart were reviewed this encounter and updated as appropriate:     Objective  Well appearing patient in no apparent distress; mood and affect are within normal limits.  A focused examination was performed including Head, neck, nails, oral mucosa, arms, abdomen, back, legs.. Relevant physical exam findings are noted in the Assessment and Plan.   Assessment & Plan  : Visit for Julie Harrington date of birth [birth year], Julie Harrington.  Thus far the pills have really been of no help.  The cream helps when she applies it but it is moderately expensive and she has extensive involvement.  Across much of her lower back are lichenoid plaques covering almost half the surface area from her lower back to her upper buttocks.  More nummular patches involving lower legs.  Clinically I still feel this fits a lichenoid dermatitis or lichen planus.  We discussed the possibility of doing patch tests as well as getting the opinion of someone at one of the medical centers.  Narrowband UVB was reviewed this would be rather inconvenient.  If this is lichen planus, there is some evidence-based therapy to a trial with oral metronidazole so a prescription was given for this.  The prescription will say to 50 mg twice daily but for the first 3weeks she will only take 1 pill daily with food.  She knows that she cannot drink alcohol while taking this medication.  If there is inadequate improvement in 6 weeks, we will decide together whether we want to do a trial of narrowband UVB, do patch testing, or  refer for second opinion at a Medical Center.  After further discussion we did confirm that Julie Harrington, Julie Harrington did fill the prescription for metronidazole already and did not see improvement with this so we will not proceed to repeat an unsuccessful therapy.  We will try and find out if there is a more affordable topical class I or class II potency steroid which she can use daily after bathing for the areas of involvement.  She is not enthusiastic about about trying light therapy because she does not want her skin to darken as well as the inconvenience.  Also discussed the possibility of low-dose methotrexate but she was displeased about the multiple drug interactions and the need for monitoring.

## 2020-05-17 NOTE — Telephone Encounter (Signed)
Phone call from patient stating that the cash pay cost for the medication we sent into her Pharmacy was too expensive.  Patient advised to have her Pharmacy run her insurance to see what the cost is with her insurance.  Patient states she'll call there Pharmacy.

## 2020-05-18 ENCOUNTER — Telehealth: Payer: Self-pay

## 2020-05-18 NOTE — Telephone Encounter (Signed)
Recived fax for PA and med is approved.    Approvedtoday CaseId:[id number];Status:Approved;Review Type:Prior Auth;Coverage Start Date:04/18/2020;Coverage End Date:05/18/2021; Drug Halcinonide 0.1% cream Form [employer] Electronic PA Form (2017 NCPDP) Original Claim Info 75 CALL HELP DESKFOR EMRG OVD, SCC=13 PER [facility] DISCRETION

## 2020-05-24 ENCOUNTER — Encounter: Payer: Self-pay | Admitting: Dermatology

## 2020-11-11 ENCOUNTER — Ambulatory Visit (INDEPENDENT_AMBULATORY_CARE_PROVIDER_SITE_OTHER): Payer: 59 | Admitting: Plastic Surgery

## 2020-11-11 ENCOUNTER — Other Ambulatory Visit: Payer: Self-pay

## 2020-11-11 ENCOUNTER — Encounter: Payer: Self-pay | Admitting: Plastic Surgery

## 2020-11-11 VITALS — BP 116/73 | HR 85 | Temp 98.6°F | Ht 66.0 in | Wt 250.0 lb

## 2020-11-11 DIAGNOSIS — M545 Low back pain, unspecified: Secondary | ICD-10-CM

## 2020-11-11 DIAGNOSIS — M546 Pain in thoracic spine: Secondary | ICD-10-CM

## 2020-11-11 DIAGNOSIS — M4004 Postural kyphosis, thoracic region: Secondary | ICD-10-CM | POA: Diagnosis not present

## 2020-11-11 DIAGNOSIS — N62 Hypertrophy of breast: Secondary | ICD-10-CM | POA: Diagnosis not present

## 2020-11-11 NOTE — Progress Notes (Signed)
Referring Provider Deatra James, MD [phone] [place],  [place] [address]   CC: No chief complaint on file.     Julie Harrington is an 44 y.o. female.  HPI: Patient presents to discuss breast reduction.  She is had years of back pain, neck pain and shoulder grooving related to her large breast.  She tried over-the-counter medications, warm packs, cold packs and supportive bras with little relief.  She is currently bigger than a triple D and wants to be around a C cup.  There is no family history of breast cancer and no history for her of previous breast procedures or biopsies.  She currently smokes 1 pack a day but does not have diabetes.  She also reports rashes beneath her breast that have been refractory to over-the-counter medications.  She does get thick keloid scars and has 1 in the central aspect of her chest from a scratch.  No Known Allergies  Outpatient Encounter Medications as of 11/11/2020  Medication Sig  . amLODipine (NORVASC) 5 MG tablet Take 5 mg by mouth daily.  Marland Kitchen buPROPion (WELLBUTRIN SR) 150 MG 12 hr tablet Take 150 mg by mouth 2 (two) times daily.  . cetirizine (ZYRTEC ALLERGY) 10 MG tablet Take 1 tablet (10 mg total) by mouth daily.  . fluticasone (FLONASE) 50 MCG/ACT nasal spray Place 2 sprays into both nostrils daily.  . halobetasol (ULTRAVATE) 0.05 % ointment Apply topically daily.  . hydrochlorothiazide (MICROZIDE) 12.5 MG capsule Take 12.5 mg by mouth daily.  . hydrocortisone 2.5 % cream Apply topically 2 (two) times daily.  . hydrOXYzine (ATARAX/VISTARIL) 10 MG tablet Take 1 tablet (10 mg total) by mouth every 6 (six) hours as needed for itching.  . minocycline (MINOCIN) 50 MG capsule Take 1 capsule (50 mg total) by mouth 2 (two) times daily.  . risperiDONE (RISPERDAL) 0.25 MG tablet Take 6 mg by mouth at bedtime.   No facility-administered encounter medications on file as of 11/11/2020.     Past Medical History:  Diagnosis Date  . Asthma   .  Depression   . Eczema   . Hypertension     Past Surgical History:  Procedure Laterality Date  . CHOLECYSTECTOMY      Family History  Problem Relation Age of Onset  . Hypertension Other   . Diabetes Other     Social History   Social History Narrative  . Not on file     Review of Systems General: Denies fevers, chills, weight loss CV: Denies chest pain, shortness of breath, palpitations  Physical Exam Vitals with BMI 11/11/2020 03/29/2020 03/26/2020  Height 5\' 6"  - -  Weight 250 lbs 249 lbs -  BMI 40.37 40.21 -  Systolic 116 139  Diastolic 73 83 81  Pulse 85 - 89    General:  No acute distress,  Alert and oriented, Non-Toxic, Normal speech and affect Breast: She has grade 3 ptosis.  Sternal notch to nipple is 37 cm bilaterally.  Nipple to fold is 14 cm bilaterally.  I do not see any obvious masses.  She has a number of scars throughout her anterior and lateral chest which she says are from boils and scratches.  She has a keloid centrally in the sternal area.  The scars on her abdomen from laparoscopic surgeries do not appear to keloid and appear to be more along the lines of hypertrophic scars.  Assessment/Plan The patient has bilateral symptomatic macromastia.  She is a reasonable candidate  for a breast reduction.  She is interested in pursuing surgical treatment.  She has tried supportive garments and fitted bras with no relief.  The details of breast reduction surgery were discussed.  I explained the procedure in detail along the with the expected scars.  The risks were discussed in detail and include bleeding, infection, damage to surrounding structures, need for additional procedures, nipple loss, change in nipple sensation, persistent pain, contour irregularities and asymmetries.  I explained that breast feeding is often not possible after breast reduction surgery.  We discussed the expected postoperative course with an overall recovery period of about 1 month.  She  demonstrated full understanding of all risks.  We discussed her personal risk factors that include cigarette smoking.  I explained the need she would need to stop cigarette smoking for 6 weeks prior to surgery.  She feels that she can do this and is done it before.  She is interested in having surgery sometime in the spring and will plan to coordinate that.  I also had a long discussion with her about the scarring potential of the procedure.  I explained that it is a bit unpredictable how thick the scars would be but that she may develop thick scars from the surgery.  She is well aware of this and is content to move forward.  I anticipate approximately 1000g of tissue removed from each side.   Julie Harrington 11/11/2020, 11:19 AM

## 2021-03-22 ENCOUNTER — Ambulatory Visit (INDEPENDENT_AMBULATORY_CARE_PROVIDER_SITE_OTHER): Payer: 59 | Admitting: Surgical

## 2021-03-22 ENCOUNTER — Encounter: Payer: Self-pay | Admitting: Surgical

## 2021-03-22 ENCOUNTER — Other Ambulatory Visit: Payer: Self-pay

## 2021-03-22 VITALS — BP 123/73 | HR 90 | Ht 66.0 in | Wt 256.0 lb

## 2021-03-22 DIAGNOSIS — N62 Hypertrophy of breast: Secondary | ICD-10-CM

## 2021-03-22 DIAGNOSIS — Z720 Tobacco use: Secondary | ICD-10-CM

## 2021-03-22 MED ORDER — HYDROCODONE-ACETAMINOPHEN 7.5-325 MG PO TABS: 1.0000 | ORAL_TABLET | Freq: Four times a day (QID) | ORAL | 0 refills | Status: AC | PRN

## 2021-03-22 MED ORDER — ONDANSETRON HCL 4 MG PO TABS: 4.0000 mg | ORAL_TABLET | Freq: Three times a day (TID) | ORAL | 0 refills | Status: DC | PRN

## 2021-03-22 NOTE — H&P (View-Only) (Signed)
Patient ID: Julie Harrington, female    DOB: 1976/08/26, 45 y.o.   MRN: [id number]  Chief Complaint  Patient presents with  . Pre-op Exam      ICD-10-CM   1. Macromastia  N62   2. Tobacco use  Z72.0 Nicotine/cotinine metabolites    History of Present Illness: Julie Harrington is a 45 y.o.  female  with a history of macromastia.  She presents for preoperative evaluation for upcoming procedure, Bilateral Breast Reduction, scheduled for 04/12/2021 with Dr.  Arita Miss  The patient has not had problems with anesthesia. No history of DVT/PE.  No family history of DVT/PE.  No family or personal history of bleeding or clotting disorders.  Patient is not currently taking any blood thinners.  No history of CVA/MI.   Summary of Previous Visit: She is currently bigger than a triple D and wants to be around a C cup.  No family history of breast cancer and no history of previous breast procedures or biopsies for her.  She currently smokes 1 pack a day.  She is not a diabetic.  She does get the keloid scars and has 1 in the central aspect of her chest from scratch.  STN is 37 cm bilaterally  Estimated excess breast tissue to be removed at time of surgery: 1000 on the left and 1000 on the right.  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   Past Medical History: Allergies: No Known Allergies  Current Medications:  Current Outpatient Medications:  .  buPROPion (WELLBUTRIN SR) 150 MG 12 hr tablet, Take 150 mg by mouth 2 (two) times daily., Disp: , Rfl:  .  HYDROcodone-acetaminophen (NORCO) 7.5-325 MG tablet, Take 1 tablet by mouth every 6 (six) hours as needed for up to 5 days for severe pain (Severe pain, post op pain only.)., Disp: 20 tablet, Rfl: 0 .  ondansetron (ZOFRAN) 4 MG tablet, Take 1 tablet (4 mg total) by mouth every 8 (eight) hours as needed for nausea or vomiting., Disp: 20 tablet, Rfl: 0 .  risperiDONE (RISPERDAL)  0.25 MG tablet, Take 6 mg by mouth at bedtime., Disp: , Rfl:  .  amLODipine (NORVASC) 5 MG tablet, Take 5 mg by mouth daily. (Patient not taking: Reported on 03/22/2021), Disp: , Rfl:  .  cetirizine (ZYRTEC ALLERGY) 10 MG tablet, Take 1 tablet (10 mg total) by mouth daily. (Patient not taking: Reported on 03/22/2021), Disp: 30 tablet, Rfl: 0 .  fluticasone (FLONASE) 50 MCG/ACT nasal spray, Place 2 sprays into both nostrils daily. (Patient not taking: Reported on 03/22/2021), Disp: 9.9 mL, Rfl: 2 .  halobetasol (ULTRAVATE) 0.05 % ointment, Apply topically daily. (Patient not taking: Reported on 03/22/2021), Disp: 50 g, Rfl: 1 .  hydrochlorothiazide (MICROZIDE) 12.5 MG capsule, Take 12.5 mg by mouth daily. (Patient not taking: Reported on 03/22/2021), Disp: , Rfl:  .  hydrocortisone 2.5 % cream, Apply topically 2 (two) times daily. (Patient not taking: Reported on 03/22/2021), Disp: 28 g, Rfl: 3 .  hydrOXYzine (ATARAX/VISTARIL) 10 MG tablet, Take 1 tablet (10 mg total) by mouth every 6 (six) hours as needed for itching. (Patient not taking: Reported on 03/22/2021), Disp: 12 tablet, Rfl: 0 .  minocycline (MINOCIN) 50 MG capsule, Take 1 capsule (50 mg total) by mouth 2 (two) times daily. (Patient not taking: Reported on 03/22/2021), Disp: 60 capsule, Rfl: 2  Past Medical Problems: Past Medical History:  Diagnosis Date  .  Asthma   . Depression   . Eczema   . Hypertension     Past Surgical History: Past Surgical History:  Procedure Laterality Date  . CHOLECYSTECTOMY      Social History: Social History   Socioeconomic History  . Marital status: Single    Spouse name: Not on file  . Number of children: Not on file  . Years of education: Not on file  . Highest education level: Not on file  Occupational History  . Not on file  Tobacco Use  . Smoking status: Current Every Day Smoker    Packs/day: 1.00    Years: 20.00    Pack years: 20.00    Types: Cigarettes  . Smokeless tobacco: Never Used   Vaping Use  . Vaping Use: Never used  Substance and Sexual Activity  . Alcohol use: Yes  . Drug use: No  . Sexual activity: Not on file  Other Topics Concern  . Not on file  Social History Narrative  . Not on file   Social Determinants of Health   Financial Resource Strain: Not on file  Food Insecurity: Not on file  Transportation Needs: Not on file  Physical Activity: Not on file  Stress: Not on file  Social Connections: Not on file  Intimate Partner Violence: Not on file    Family History: Family History  Problem Relation Age of Onset  . Hypertension Other   . Diabetes Other     Review of Systems: Review of Systems  Constitutional: Negative.   Respiratory: Negative.   Cardiovascular: Negative.   Gastrointestinal: Negative.   Neurological: Negative.     Physical Exam: Vital Signs BP 123/73 (BP Location: Left Arm, Patient Position: Sitting, Cuff Size: Large)   Pulse 90   Ht 5\' 6"  (1.676 m)   Wt 256 lb (116.1 kg)   SpO2 96%   BMI 41.32 kg/m   Physical Exam Constitutional:      General: Not in acute distress.    Appearance: Normal appearance. Not ill-appearing.  HENT:     Head: Normocephalic and atraumatic.  Eyes:     Pupils: Pupils are equal, round Neck:     Musculoskeletal: Normal range of motion.  Cardiovascular:     Rate and Rhythm: Normal rate    Pulses: Normal pulses.  Pulmonary:     Effort: Pulmonary effort is normal. No respiratory distress.  Abdominal:     General: Abdomen is flat. There is no distension.  Musculoskeletal: Normal range of motion.  Skin:    General: Skin is warm and dry.     Findings: No erythema or rash.  Neurological:     General: No focal deficit present.     Mental Status: Alert and oriented to person, place, and time. Mental status is at baseline.     Motor: No weakness.  Psychiatric:        Mood and Affect: Mood normal.        Behavior: Behavior normal.    Assessment/Plan: The patient is scheduled for  bilateral breast reduction with Dr. .  Risks, benefits, and alternatives of procedure discussed, questions answered and consent obtained.    Smoking Status: Quit smoking December 11, 2020; Counseling Given?  Yes, patient is also going to get a nicotine test preoperatively Last Mammogram: No mammographic history; Results: N/A Consulted with Dr. December 13, 2020 to see if you would like to order a mammogram preoperatively.  I discussed this with the patient and will let her know.  Addendum: We  will order patient screening mammogram prior to surgery.  Will call patient to let her know this needs to be completed and resulted preoperatively.  Caprini Score: 5, high; Risk Factors include: Age, BMI greater than 40, and length of planned surgery. Recommendation for mechanical and pharmacological prophylaxis. Encourage early ambulation.   Pictures obtained: 11/11/2020  Post-op Rx sent to pharmacy: Norco, Zofran  Patient was provided with the breast reduction and General Surgical Risk consent document and Pain Medication Agreement prior to their appointment.  They had adequate time to read through the risk consent documents and Pain Medication Agreement. We also discussed them in person together during this preop appointment. All of their questions were answered to their satisfaction.  Recommended calling if they have any further questions.  Risk consent form and Pain Medication Agreement to be scanned into patient's chart.  The risk that can be encountered with breast reduction were discussed and include the following but not limited to these:  Breast asymmetry, fluid accumulation, firmness of the breast, inability to breast feed, loss of nipple or areola, skin loss, decrease or no nipple sensation, fat necrosis of the breast tissue, bleeding, infection, healing delay.  There are risks of anesthesia, changes to skin sensation and injury to nerves or blood vessels.  The muscle can be temporarily or permanently injured.   You may have an allergic reaction to tape, suture, glue, blood products which can result in skin discoloration, swelling, pain, skin lesions, poor healing.  Any of these can lead to the need for revisonal surgery or stage procedures.  A reduction has potential to interfere with diagnostic procedures.  Nipple or breast piercing can increase risks of infection.  This procedure is best done when the breast is fully developed.  Changes in the breast will continue to occur over time.  Pregnancy can alter the outcomes of previous breast reduction surgery, weight gain and weigh loss can also effect the long term appearance.   Patient has a history of keloiding, she is aware of the risks of keloiding after the surgery.  She is willing to move forward.  Discussed with patient she will have likely have Penrose drains postoperatively, she is comfortable with this.   Electronically signed by: Kermit Balo Taliyah Watrous, PA-C 03/22/2021 3:08 PM

## 2021-03-22 NOTE — Progress Notes (Addendum)
Patient ID: Julie Harrington, female    DOB: 1976/08/26, 45 y.o.   MRN: [id number]  Chief Complaint  Patient presents with  . Pre-op Exam      ICD-10-CM   1. Macromastia  N62   2. Tobacco use  Z72.0 Nicotine/cotinine metabolites    History of Present Illness: Julie Harrington is a 45 y.o.  female  with a history of macromastia.  She presents for preoperative evaluation for upcoming procedure, Bilateral Breast Reduction, scheduled for 04/12/2021 with Dr.  Arita Miss  The patient has not had problems with anesthesia. No history of DVT/PE.  No family history of DVT/PE.  No family or personal history of bleeding or clotting disorders.  Patient is not currently taking any blood thinners.  No history of CVA/MI.   Summary of Previous Visit: She is currently bigger than a triple D and wants to be around a C cup.  No family history of breast cancer and no history of previous breast procedures or biopsies for her.  She currently smokes 1 pack a day.  She is not a diabetic.  She does get the keloid scars and has 1 in the central aspect of her chest from scratch.  STN is 37 cm bilaterally  Estimated excess breast tissue to be removed at time of surgery: 1000 on the left and 1000 on the right.  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   Past Medical History: Allergies: No Known Allergies  Current Medications:  Current Outpatient Medications:  .  buPROPion (WELLBUTRIN SR) 150 MG 12 hr tablet, Take 150 mg by mouth 2 (two) times daily., Disp: , Rfl:  .  HYDROcodone-acetaminophen (NORCO) 7.5-325 MG tablet, Take 1 tablet by mouth every 6 (six) hours as needed for up to 5 days for severe pain (Severe pain, post op pain only.)., Disp: 20 tablet, Rfl: 0 .  ondansetron (ZOFRAN) 4 MG tablet, Take 1 tablet (4 mg total) by mouth every 8 (eight) hours as needed for nausea or vomiting., Disp: 20 tablet, Rfl: 0 .  risperiDONE (RISPERDAL)  0.25 MG tablet, Take 6 mg by mouth at bedtime., Disp: , Rfl:  .  amLODipine (NORVASC) 5 MG tablet, Take 5 mg by mouth daily. (Patient not taking: Reported on 03/22/2021), Disp: , Rfl:  .  cetirizine (ZYRTEC ALLERGY) 10 MG tablet, Take 1 tablet (10 mg total) by mouth daily. (Patient not taking: Reported on 03/22/2021), Disp: 30 tablet, Rfl: 0 .  fluticasone (FLONASE) 50 MCG/ACT nasal spray, Place 2 sprays into both nostrils daily. (Patient not taking: Reported on 03/22/2021), Disp: 9.9 mL, Rfl: 2 .  halobetasol (ULTRAVATE) 0.05 % ointment, Apply topically daily. (Patient not taking: Reported on 03/22/2021), Disp: 50 g, Rfl: 1 .  hydrochlorothiazide (MICROZIDE) 12.5 MG capsule, Take 12.5 mg by mouth daily. (Patient not taking: Reported on 03/22/2021), Disp: , Rfl:  .  hydrocortisone 2.5 % cream, Apply topically 2 (two) times daily. (Patient not taking: Reported on 03/22/2021), Disp: 28 g, Rfl: 3 .  hydrOXYzine (ATARAX/VISTARIL) 10 MG tablet, Take 1 tablet (10 mg total) by mouth every 6 (six) hours as needed for itching. (Patient not taking: Reported on 03/22/2021), Disp: 12 tablet, Rfl: 0 .  minocycline (MINOCIN) 50 MG capsule, Take 1 capsule (50 mg total) by mouth 2 (two) times daily. (Patient not taking: Reported on 03/22/2021), Disp: 60 capsule, Rfl: 2  Past Medical Problems: Past Medical History:  Diagnosis Date  .  Asthma   . Depression   . Eczema   . Hypertension     Past Surgical History: Past Surgical History:  Procedure Laterality Date  . CHOLECYSTECTOMY      Social History: Social History   Socioeconomic History  . Marital status: Single    Spouse name: Not on file  . Number of children: Not on file  . Years of education: Not on file  . Highest education level: Not on file  Occupational History  . Not on file  Tobacco Use  . Smoking status: Current Every Day Smoker    Packs/day: 1.00    Years: 20.00    Pack years: 20.00    Types: Cigarettes  . Smokeless tobacco: Never Used   Vaping Use  . Vaping Use: Never used  Substance and Sexual Activity  . Alcohol use: Yes  . Drug use: No  . Sexual activity: Not on file  Other Topics Concern  . Not on file  Social History Narrative  . Not on file   Social Determinants of Health   Financial Resource Strain: Not on file  Food Insecurity: Not on file  Transportation Needs: Not on file  Physical Activity: Not on file  Stress: Not on file  Social Connections: Not on file  Intimate Partner Violence: Not on file    Family History: Family History  Problem Relation Age of Onset  . Hypertension Other   . Diabetes Other     Review of Systems: Review of Systems  Constitutional: Negative.   Respiratory: Negative.   Cardiovascular: Negative.   Gastrointestinal: Negative.   Neurological: Negative.     Physical Exam: Vital Signs BP 123/73 (BP Location: Left Arm, Patient Position: Sitting, Cuff Size: Large)   Pulse 90   Ht 5\' 6"  (1.676 m)   Wt 256 lb (116.1 kg)   SpO2 96%   BMI 41.32 kg/m   Physical Exam Constitutional:      General: Not in acute distress.    Appearance: Normal appearance. Not ill-appearing.  HENT:     Head: Normocephalic and atraumatic.  Eyes:     Pupils: Pupils are equal, round Neck:     Musculoskeletal: Normal range of motion.  Cardiovascular:     Rate and Rhythm: Normal rate    Pulses: Normal pulses.  Pulmonary:     Effort: Pulmonary effort is normal. No respiratory distress.  Abdominal:     General: Abdomen is flat. There is no distension.  Musculoskeletal: Normal range of motion.  Skin:    General: Skin is warm and dry.     Findings: No erythema or rash.  Neurological:     General: No focal deficit present.     Mental Status: Alert and oriented to person, place, and time. Mental status is at baseline.     Motor: No weakness.  Psychiatric:        Mood and Affect: Mood normal.        Behavior: Behavior normal.    Assessment/Plan: The patient is scheduled for  bilateral breast reduction with Dr. .  Risks, benefits, and alternatives of procedure discussed, questions answered and consent obtained.    Smoking Status: Quit smoking December 11, 2020; Counseling Given?  Yes, patient is also going to get a nicotine test preoperatively Last Mammogram: No mammographic history; Results: N/A Consulted with Dr. December 13, 2020 to see if you would like to order a mammogram preoperatively.  I discussed this with the patient and will let her know.  Addendum: We  will order patient screening mammogram prior to surgery.  Will call patient to let her know this needs to be completed and resulted preoperatively.  Caprini Score: 5, high; Risk Factors include: Age, BMI greater than 40, and length of planned surgery. Recommendation for mechanical and pharmacological prophylaxis. Encourage early ambulation.   Pictures obtained: 11/11/2020  Post-op Rx sent to pharmacy: Norco, Zofran  Patient was provided with the breast reduction and General Surgical Risk consent document and Pain Medication Agreement prior to their appointment.  They had adequate time to read through the risk consent documents and Pain Medication Agreement. We also discussed them in person together during this preop appointment. All of their questions were answered to their satisfaction.  Recommended calling if they have any further questions.  Risk consent form and Pain Medication Agreement to be scanned into patient's chart.  The risk that can be encountered with breast reduction were discussed and include the following but not limited to these:  Breast asymmetry, fluid accumulation, firmness of the breast, inability to breast feed, loss of nipple or areola, skin loss, decrease or no nipple sensation, fat necrosis of the breast tissue, bleeding, infection, healing delay.  There are risks of anesthesia, changes to skin sensation and injury to nerves or blood vessels.  The muscle can be temporarily or permanently injured.   You may have an allergic reaction to tape, suture, glue, blood products which can result in skin discoloration, swelling, pain, skin lesions, poor healing.  Any of these can lead to the need for revisonal surgery or stage procedures.  A reduction has potential to interfere with diagnostic procedures.  Nipple or breast piercing can increase risks of infection.  This procedure is best done when the breast is fully developed.  Changes in the breast will continue to occur over time.  Pregnancy can alter the outcomes of previous breast reduction surgery, weight gain and weigh loss can also effect the long term appearance.   Patient has a history of keloiding, she is aware of the risks of keloiding after the surgery.  She is willing to move forward.  Discussed with patient she will have likely have Penrose drains postoperatively, she is comfortable with this.   Electronically signed by: Delainee Tramel J Sashia Campas, PA-C 03/22/2021 3:08 PM 

## 2021-03-22 NOTE — Addendum Note (Signed)
Addended byKeenan Bachelor on: 03/22/2021 04:09 PM   Modules accepted: Orders

## 2021-03-26 ENCOUNTER — Ambulatory Visit
Admission: RE | Admit: 2021-03-26 | Discharge: 2021-03-26 | Disposition: A | Discharge status: Home / Self Care | Payer: 59 | Source: Ambulatory Visit | Attending: Surgical | Admitting: Surgical

## 2021-03-26 ENCOUNTER — Other Ambulatory Visit: Payer: Self-pay

## 2021-03-26 DIAGNOSIS — N62 Hypertrophy of breast: Secondary | ICD-10-CM

## 2021-03-29 LAB — NICOTINE/COTININE METABOLITES
Cotinine: <1 ng/mL
Nicotine: <1 ng/mL

## 2021-04-01 ENCOUNTER — Telehealth: Payer: Self-pay

## 2021-04-01 NOTE — Telephone Encounter (Signed)
Returned patients call LMVM. Under the advise from [facility], she is okay to start taking the metronidazole until the morning of her surgery, only using a sip of water to swallow.

## 2021-04-01 NOTE — Telephone Encounter (Signed)
Patient called to say that she is having surgery with [place] on 04/12/2021 and one of her doctors just prescribed a new medicine for her.  Patient would like to know if it's ok for her to start taking this medicine in light of her surgery date.  Please call.

## 2021-04-04 ENCOUNTER — Encounter (HOSPITAL_BASED_OUTPATIENT_CLINIC_OR_DEPARTMENT_OTHER): Payer: Self-pay | Admitting: Plastic Surgery

## 2021-04-04 ENCOUNTER — Other Ambulatory Visit: Payer: Self-pay

## 2021-04-06 ENCOUNTER — Encounter (HOSPITAL_BASED_OUTPATIENT_CLINIC_OR_DEPARTMENT_OTHER)
Admission: RE | Admit: 2021-04-06 | Discharge: 2021-04-06 | Disposition: A | Discharge status: Home / Self Care | Payer: 59 | Source: Ambulatory Visit | Attending: Plastic Surgery | Admitting: Plastic Surgery

## 2021-04-06 DIAGNOSIS — Z01818 Encounter for other preprocedural examination: Secondary | ICD-10-CM | POA: Diagnosis not present

## 2021-04-06 LAB — POCT PREGNANCY, URINE: Preg Test, Ur: NEGATIVE

## 2021-04-09 ENCOUNTER — Other Ambulatory Visit (HOSPITAL_COMMUNITY): Payer: 59

## 2021-04-11 ENCOUNTER — Other Ambulatory Visit (HOSPITAL_COMMUNITY)
Admission: RE | Admit: 2021-04-11 | Discharge: 2021-04-11 | Disposition: A | Discharge status: Home / Self Care | Payer: 59 | Source: Ambulatory Visit | Attending: Plastic Surgery | Admitting: Plastic Surgery

## 2021-04-11 DIAGNOSIS — Z01812 Encounter for preprocedural laboratory examination: Secondary | ICD-10-CM | POA: Diagnosis present

## 2021-04-11 DIAGNOSIS — Z20822 Contact with and (suspected) exposure to covid-19: Secondary | ICD-10-CM | POA: Diagnosis not present

## 2021-04-11 NOTE — Anesthesia Preprocedure Evaluation (Addendum)
Anesthesia Evaluation  Patient identified by MRN, date of birth, ID band Patient awake    Reviewed: Allergy & Precautions, NPO status , Patient's Chart, lab work & pertinent test results  Airway Mallampati: III  TM Distance: >3 FB Neck ROM: Full    Dental no notable dental hx. (+) Teeth Intact, Dental Advisory Given   Pulmonary sleep apnea and Continuous Positive Airway Pressure Ventilation , former smoker,  20 pack year history, quit January 2022   Pulmonary exam normal breath sounds clear to auscultation       Cardiovascular hypertension, Pt. on medications Normal cardiovascular exam Rhythm:Regular Rate:Normal     Neuro/Psych PSYCHIATRIC DISORDERS Depression Bipolar Disorder negative neurological ROS     GI/Hepatic negative GI ROS, Neg liver ROS,   Endo/Other  Morbid obesityBMI 41  Renal/GU negative Renal ROS  negative genitourinary   Musculoskeletal negative musculoskeletal ROS (+)   Abdominal (+) + obese,   Peds  Hematology negative hematology ROS (+)   Anesthesia Other Findings Macrostomia   Reproductive/Obstetrics negative OB ROS                            Anesthesia Physical Anesthesia Plan  ASA: III  Anesthesia Plan: General   Post-op Pain Management:    Induction: Intravenous  PONV Risk Score and Plan: 4 or greater and Dexamethasone, Ondansetron, Midazolam, Scopolamine patch - Pre-op and Treatment may vary due to age or medical condition  Airway Management Planned: Oral ETT  Additional Equipment: None  Intra-op Plan:   Post-operative Plan: Extubation in OR  Informed Consent: I have reviewed the patients History and Physical, chart, labs and discussed the procedure including the risks, benefits and alternatives for the proposed anesthesia with the patient or authorized representative who has indicated his/her understanding and acceptance.     Dental advisory  given  Plan Discussed with: CRNA  Anesthesia Plan Comments:        Anesthesia Quick Evaluation

## 2021-04-12 ENCOUNTER — Other Ambulatory Visit: Payer: Self-pay

## 2021-04-12 ENCOUNTER — Ambulatory Visit (HOSPITAL_BASED_OUTPATIENT_CLINIC_OR_DEPARTMENT_OTHER): Payer: 59 | Admitting: Anesthesiology

## 2021-04-12 ENCOUNTER — Ambulatory Visit (HOSPITAL_BASED_OUTPATIENT_CLINIC_OR_DEPARTMENT_OTHER)
Admission: RE | Admit: 2021-04-12 | Discharge: 2021-04-12 | Disposition: A | Discharge status: Home / Self Care | Payer: 59 | Attending: Plastic Surgery | Admitting: Plastic Surgery

## 2021-04-12 ENCOUNTER — Encounter (HOSPITAL_BASED_OUTPATIENT_CLINIC_OR_DEPARTMENT_OTHER): Payer: Self-pay | Admitting: Plastic Surgery

## 2021-04-12 ENCOUNTER — Encounter (HOSPITAL_BASED_OUTPATIENT_CLINIC_OR_DEPARTMENT_OTHER)
Admission: RE | Disposition: A | Discharge status: Home / Self Care | Payer: Self-pay | Source: Home / Self Care | Attending: Plastic Surgery

## 2021-04-12 DIAGNOSIS — N62 Hypertrophy of breast: Secondary | ICD-10-CM

## 2021-04-12 DIAGNOSIS — G4733 Obstructive sleep apnea (adult) (pediatric): Secondary | ICD-10-CM | POA: Insufficient documentation

## 2021-04-12 DIAGNOSIS — Z8249 Family history of ischemic heart disease and other diseases of the circulatory system: Secondary | ICD-10-CM | POA: Insufficient documentation

## 2021-04-12 DIAGNOSIS — M546 Pain in thoracic spine: Secondary | ICD-10-CM

## 2021-04-12 DIAGNOSIS — Z833 Family history of diabetes mellitus: Secondary | ICD-10-CM | POA: Diagnosis not present

## 2021-04-12 DIAGNOSIS — I10 Essential (primary) hypertension: Secondary | ICD-10-CM | POA: Insufficient documentation

## 2021-04-12 DIAGNOSIS — F17210 Nicotine dependence, cigarettes, uncomplicated: Secondary | ICD-10-CM | POA: Insufficient documentation

## 2021-04-12 DIAGNOSIS — M545 Low back pain, unspecified: Secondary | ICD-10-CM

## 2021-04-12 DIAGNOSIS — Z79899 Other long term (current) drug therapy: Secondary | ICD-10-CM | POA: Diagnosis not present

## 2021-04-12 DIAGNOSIS — F319 Bipolar disorder, unspecified: Secondary | ICD-10-CM | POA: Insufficient documentation

## 2021-04-12 HISTORY — PX: BREAST REDUCTION SURGERY: SHX8

## 2021-04-12 HISTORY — DX: Sleep apnea, unspecified: G47.30

## 2021-04-12 HISTORY — DX: Bipolar disorder, unspecified: F31.9

## 2021-04-12 LAB — SARS CORONAVIRUS 2 (TAT 6-24 HRS): SARS Coronavirus 2: NEGATIVE

## 2021-04-12 LAB — POCT PREGNANCY, URINE: Preg Test, Ur: NEGATIVE

## 2021-04-12 SURGERY — MAMMOPLASTY, REDUCTION
Anesthesia: General | Site: Breast | Laterality: Bilateral

## 2021-04-12 MED ORDER — ROCURONIUM BROMIDE 100 MG/10ML IV SOLN
INTRAVENOUS | Status: DC | PRN
  Administered 2021-04-12: 100 mg via INTRAVENOUS

## 2021-04-12 MED ORDER — CEFAZOLIN SODIUM-DEXTROSE 2-4 GM/100ML-% IV SOLN
INTRAVENOUS | Status: AC
  Filled 2021-04-12: qty 100

## 2021-04-12 MED ORDER — LIDOCAINE 2% (20 MG/ML) 5 ML SYRINGE
INTRAMUSCULAR | Status: AC
  Filled 2021-04-12: qty 5

## 2021-04-12 MED ORDER — PHENYLEPHRINE 40 MCG/ML (10ML) SYRINGE FOR IV PUSH (FOR BLOOD PRESSURE SUPPORT)
PREFILLED_SYRINGE | INTRAVENOUS | Status: AC
  Filled 2021-04-12: qty 10

## 2021-04-12 MED ORDER — DEXAMETHASONE SODIUM PHOSPHATE 4 MG/ML IJ SOLN
INTRAMUSCULAR | Status: DC | PRN
  Administered 2021-04-12: 10 mg via INTRAVENOUS

## 2021-04-12 MED ORDER — MEPERIDINE HCL 25 MG/ML IJ SOLN: 6.2500 mg | INTRAMUSCULAR | Status: DC | PRN

## 2021-04-12 MED ORDER — 0.9 % SODIUM CHLORIDE (POUR BTL) OPTIME
TOPICAL | Status: DC | PRN
  Administered 2021-04-12: 1000 mL

## 2021-04-12 MED ORDER — LIDOCAINE HCL (CARDIAC) PF 100 MG/5ML IV SOSY
PREFILLED_SYRINGE | INTRAVENOUS | Status: DC | PRN
  Administered 2021-04-12: 100 mg via INTRAVENOUS

## 2021-04-12 MED ORDER — SUCCINYLCHOLINE CHLORIDE 200 MG/10ML IV SOSY
PREFILLED_SYRINGE | INTRAVENOUS | Status: AC
  Filled 2021-04-12: qty 10

## 2021-04-12 MED ORDER — OXYCODONE HCL 5 MG PO TABS
5.0000 mg | ORAL_TABLET | Freq: Once | ORAL | Status: AC | PRN
  Administered 2021-04-12: 5 mg via ORAL

## 2021-04-12 MED ORDER — DEXAMETHASONE SODIUM PHOSPHATE 10 MG/ML IJ SOLN
INTRAMUSCULAR | Status: AC
  Filled 2021-04-12: qty 1

## 2021-04-12 MED ORDER — LACTATED RINGERS IV SOLN: INTRAVENOUS | Status: DC | PRN

## 2021-04-12 MED ORDER — SCOPOLAMINE 1 MG/3DAYS TD PT72
1.0000 | MEDICATED_PATCH | TRANSDERMAL | Status: DC
  Administered 2021-04-12: 1.5 mg via TRANSDERMAL

## 2021-04-12 MED ORDER — OXYCODONE HCL 5 MG PO TABS
ORAL_TABLET | ORAL | Status: AC
  Filled 2021-04-12: qty 1

## 2021-04-12 MED ORDER — ONDANSETRON HCL 4 MG/2ML IJ SOLN
INTRAMUSCULAR | Status: AC
  Filled 2021-04-12: qty 2

## 2021-04-12 MED ORDER — EPHEDRINE 5 MG/ML INJ
INTRAVENOUS | Status: AC
  Filled 2021-04-12: qty 10

## 2021-04-12 MED ORDER — SUFENTANIL CITRATE 50 MCG/ML IV SOLN
INTRAVENOUS | Status: AC
  Filled 2021-04-12: qty 1

## 2021-04-12 MED ORDER — SUGAMMADEX SODIUM 500 MG/5ML IV SOLN
INTRAVENOUS | Status: DC | PRN
  Administered 2021-04-12: 400 mg via INTRAVENOUS

## 2021-04-12 MED ORDER — DROPERIDOL 2.5 MG/ML IJ SOLN
INTRAMUSCULAR | Status: DC | PRN
  Administered 2021-04-12: .625 mg via INTRAVENOUS

## 2021-04-12 MED ORDER — HYDROMORPHONE HCL 1 MG/ML IJ SOLN
0.2500 mg | INTRAMUSCULAR | Status: DC | PRN
  Administered 2021-04-12 (×2): 0.25 mg via INTRAVENOUS

## 2021-04-12 MED ORDER — PROPOFOL 10 MG/ML IV BOLUS
INTRAVENOUS | Status: DC | PRN
  Administered 2021-04-12: 200 mg via INTRAVENOUS

## 2021-04-12 MED ORDER — CEFAZOLIN SODIUM-DEXTROSE 2-4 GM/100ML-% IV SOLN
2.0000 g | INTRAVENOUS | Status: AC
  Administered 2021-04-12: 2 g via INTRAVENOUS

## 2021-04-12 MED ORDER — MIDAZOLAM HCL 5 MG/5ML IJ SOLN
INTRAMUSCULAR | Status: DC | PRN
  Administered 2021-04-12: 2 mg via INTRAVENOUS

## 2021-04-12 MED ORDER — LACTATED RINGERS IV SOLN: INTRAVENOUS | Status: DC

## 2021-04-12 MED ORDER — ACETAMINOPHEN 500 MG PO TABS
ORAL_TABLET | ORAL | Status: AC
  Filled 2021-04-12: qty 2

## 2021-04-12 MED ORDER — ACETAMINOPHEN 500 MG PO TABS
1000.0000 mg | ORAL_TABLET | Freq: Once | ORAL | Status: AC
  Administered 2021-04-12: 1000 mg via ORAL

## 2021-04-12 MED ORDER — SUGAMMADEX SODIUM 500 MG/5ML IV SOLN
INTRAVENOUS | Status: AC
  Filled 2021-04-12: qty 5

## 2021-04-12 MED ORDER — ONDANSETRON HCL 4 MG/2ML IJ SOLN
INTRAMUSCULAR | Status: DC | PRN
  Administered 2021-04-12: 4 mg via INTRAVENOUS

## 2021-04-12 MED ORDER — SUFENTANIL CITRATE 50 MCG/ML IV SOLN
INTRAVENOUS | Status: DC | PRN
  Administered 2021-04-12: 20 ug via INTRAVENOUS
  Administered 2021-04-12: 5 ug via INTRAVENOUS
  Administered 2021-04-12: 10 ug via INTRAVENOUS

## 2021-04-12 MED ORDER — OXYCODONE HCL 5 MG/5ML PO SOLN: 5.0000 mg | Freq: Once | ORAL | Status: AC | PRN

## 2021-04-12 MED ORDER — HYDROMORPHONE HCL 1 MG/ML IJ SOLN
INTRAMUSCULAR | Status: AC
  Filled 2021-04-12: qty 0.5

## 2021-04-12 MED ORDER — ROCURONIUM BROMIDE 10 MG/ML (PF) SYRINGE
PREFILLED_SYRINGE | INTRAVENOUS | Status: AC
  Filled 2021-04-12: qty 10

## 2021-04-12 MED ORDER — MIDAZOLAM HCL 2 MG/2ML IJ SOLN
INTRAMUSCULAR | Status: AC
  Filled 2021-04-12: qty 2

## 2021-04-12 MED ORDER — SCOPOLAMINE 1 MG/3DAYS TD PT72
MEDICATED_PATCH | TRANSDERMAL | Status: AC
  Filled 2021-04-12: qty 1

## 2021-04-12 MED ORDER — PROMETHAZINE HCL 25 MG/ML IJ SOLN: 6.2500 mg | INTRAMUSCULAR | Status: DC | PRN

## 2021-04-12 SURGICAL SUPPLY — 72 items
APL PRP STRL LF DISP 70% ISPRP (MISCELLANEOUS) ×2
APL SKNCLS STERI-STRIP NONHPOA (GAUZE/BANDAGES/DRESSINGS) ×2
BAG DECANTER FOR FLEXI CONT (MISCELLANEOUS) IMPLANT
BENZOIN TINCTURE PRP APPL 2/3 (GAUZE/BANDAGES/DRESSINGS) ×4 IMPLANT
BLADE SURG 10 STRL SS (BLADE) ×4 IMPLANT
BLADE SURG 15 STRL LF DISP TIS (BLADE) ×1 IMPLANT
BLADE SURG 15 STRL SS (BLADE) ×2
BNDG ELASTIC 6X5.8 VLCR STR LF (GAUZE/BANDAGES/DRESSINGS) ×4 IMPLANT
CANISTER SUCT 1200ML W/VALVE (MISCELLANEOUS) ×2 IMPLANT
CHLORAPREP W/TINT 26 (MISCELLANEOUS) ×4 IMPLANT
CLIP VESOCCLUDE MED 6/CT (CLIP) IMPLANT
COVER BACK TABLE 60X90IN (DRAPES) ×2 IMPLANT
COVER MAYO STAND STRL (DRAPES) ×2 IMPLANT
COVER WAND RF STERILE (DRAPES) IMPLANT
DECANTER SPIKE VIAL GLASS SM (MISCELLANEOUS) IMPLANT
DRAIN CHANNEL 15F RND FF W/TCR (WOUND CARE) IMPLANT
DRAIN PENROSE 1/2X12 LTX STRL (WOUND CARE) ×2 IMPLANT
DRAPE LAPAROSCOPIC ABDOMINAL (DRAPES) ×2 IMPLANT
DRAPE UTILITY XL STRL (DRAPES) ×2 IMPLANT
DRSG PAD ABDOMINAL 8X10 ST (GAUZE/BANDAGES/DRESSINGS) ×10 IMPLANT
ELECT REM PT RETURN 9FT ADLT (ELECTROSURGICAL) ×2
ELECTRODE REM PT RTRN 9FT ADLT (ELECTROSURGICAL) ×1 IMPLANT
EVACUATOR SILICONE 100CC (DRAIN) IMPLANT
GAUZE SPONGE 4X4 12PLY STRL (GAUZE/BANDAGES/DRESSINGS) ×4 IMPLANT
GAUZE XEROFORM 5X9 LF (GAUZE/BANDAGES/DRESSINGS) IMPLANT
GLOVE SRG 8 PF TXTR STRL LF DI (GLOVE) IMPLANT
GLOVE SURG ENC MOIS LTX SZ6.5 (GLOVE) ×2 IMPLANT
GLOVE SURG ENC MOIS LTX SZ7.5 (GLOVE) IMPLANT
GLOVE SURG ENC TEXT LTX SZ7.5 (GLOVE) ×2 IMPLANT
GLOVE SURG LTX SZ6.5 (GLOVE) IMPLANT
GLOVE SURG POLYISO LF SZ6.5 (GLOVE) ×2 IMPLANT
GLOVE SURG POLYISO LF SZ8 (GLOVE) ×2 IMPLANT
GLOVE SURG UNDER POLY LF SZ7 (GLOVE) ×4 IMPLANT
GLOVE SURG UNDER POLY LF SZ8 (GLOVE)
GOWN STRL REUS W/ TWL LRG LVL3 (GOWN DISPOSABLE) ×3 IMPLANT
GOWN STRL REUS W/TWL 2XL LVL3 (GOWN DISPOSABLE) ×2 IMPLANT
GOWN STRL REUS W/TWL LRG LVL3 (GOWN DISPOSABLE) ×6
MARKER SKIN DUAL TIP RULER LAB (MISCELLANEOUS) IMPLANT
NDL SAFETY ECLIPSE 18X1.5 (NEEDLE) IMPLANT
NEEDLE FILTER BLUNT 18X 1/2SAF (NEEDLE) ×1
NEEDLE FILTER BLUNT 18X1 1/2 (NEEDLE) ×1 IMPLANT
NEEDLE HYPO 18GX1.5 SHARP (NEEDLE)
NEEDLE HYPO 25X1 1.5 SAFETY (NEEDLE) IMPLANT
NEEDLE SPNL 18GX3.5 QUINCKE PK (NEEDLE) ×2 IMPLANT
NS IRRIG 1000ML POUR BTL (IV SOLUTION) ×2 IMPLANT
PACK BASIN DAY SURGERY FS (CUSTOM PROCEDURE TRAY) ×2 IMPLANT
PENCIL SMOKE EVACUATOR (MISCELLANEOUS) ×2 IMPLANT
PIN SAFETY STERILE (MISCELLANEOUS) IMPLANT
SHEET MEDIUM DRAPE 40X70 STRL (DRAPES) ×4 IMPLANT
SLEEVE SCD COMPRESS KNEE MED (STOCKING) ×2 IMPLANT
SPONGE LAP 18X18 RF (DISPOSABLE) ×8 IMPLANT
STAPLER INSORB 30 2030 C-SECTI (MISCELLANEOUS) ×4 IMPLANT
STAPLER VISISTAT 35W (STAPLE) ×4 IMPLANT
STRIP SUTURE WOUND CLOSURE 1/2 (MISCELLANEOUS) ×6 IMPLANT
SUT CHROMIC 4 0 PS 2 18 (SUTURE) IMPLANT
SUT ETHILON 2 0 FS 18 (SUTURE) IMPLANT
SUT ETHILON 3 0 PS 1 (SUTURE) IMPLANT
SUT MNCRL AB 4-0 PS2 18 (SUTURE) ×4 IMPLANT
SUT PDS 3-0 CT2 (SUTURE) ×4
SUT PDS II 3-0 CT2 27 ABS (SUTURE) ×2 IMPLANT
SUT VIC AB 3-0 PS1 18 (SUTURE)
SUT VIC AB 3-0 PS1 18XBRD (SUTURE) IMPLANT
SUT VLOC 180 P-14 24 (SUTURE) ×6 IMPLANT
SYR 50ML LL SCALE MARK (SYRINGE) IMPLANT
SYR BULB IRRIG 60ML STRL (SYRINGE) ×2 IMPLANT
SYR CONTROL 10ML LL (SYRINGE) IMPLANT
TAPE MEASURE VINYL STERILE (MISCELLANEOUS) IMPLANT
TOWEL GREEN STERILE FF (TOWEL DISPOSABLE) ×4 IMPLANT
TUBE CONNECTING 20X1/4 (TUBING) ×2 IMPLANT
TUBING INFILTRATION IT-10001 (TUBING) ×2 IMPLANT
UNDERPAD 30X36 HEAVY ABSORB (UNDERPADS AND DIAPERS) ×4 IMPLANT
YANKAUER SUCT BULB TIP NO VENT (SUCTIONS) ×2 IMPLANT

## 2021-04-12 NOTE — Anesthesia Postprocedure Evaluation (Signed)
Anesthesia Post Note  Patient: Lemoyne Scarpati  Procedure(s) Performed: MAMMARY REDUCTION  (BREAST) (Bilateral Breast)     Patient location during evaluation: PACU Anesthesia Type: General Level of consciousness: awake and alert, oriented and patient cooperative Pain management: pain level controlled Vital Signs Assessment: post-procedure vital signs reviewed and stable Respiratory status: spontaneous breathing, nonlabored ventilation and respiratory function stable Cardiovascular status: blood pressure returned to baseline and stable Postop Assessment: no apparent nausea or vomiting Anesthetic complications: no   No complications documented.  Last Vitals:  Vitals:   04/12/21 1245 04/12/21 1300  BP: 126/79 127/82  Pulse: 80 (!) 57  Resp: 15 14  Temp:    SpO2: 100% 100%    Last Pain:  Vitals:   04/12/21 1253  TempSrc:   PainSc: 5                  Lannie Fields

## 2021-04-12 NOTE — Transfer of Care (Signed)
Immediate Anesthesia Transfer of Care Note  Patient: Julie Harrington  Procedure(s) Performed: MAMMARY REDUCTION  (BREAST) (Bilateral Breast)  Patient Location: PACU  Anesthesia Type:General  Level of Consciousness: awake and alert   Airway & Oxygen Therapy: Patient Spontanous Breathing and Patient connected to face mask oxygen  Post-op Assessment: Report given to RN and Post -op Vital signs reviewed and stable  Post vital signs: Reviewed and stable  Last Vitals:  Vitals Value Taken Time  BP 126/79 04/12/21 1236  Temp 36.6 C 04/12/21 1236  Pulse 88 04/12/21 1242  Resp 13 04/12/21 1244  SpO2 100 % 04/12/21 1242  Vitals shown include unvalidated device data.  Last Pain:  Vitals:   04/12/21 0851  TempSrc: Oral  PainSc: 0-No pain         Complications: No complications documented.

## 2021-04-12 NOTE — Interval H&P Note (Signed)
History and Physical Interval Note:  04/12/2021 9:40 AM  Julie Harrington  has presented today for surgery, with the diagnosis of macromastia.  The various methods of treatment have been discussed with the patient and family. After consideration of risks, benefits and other options for treatment, the patient has consented to  Procedure(s) with comments: MAMMARY REDUCTION  (BREAST) (Bilateral) - 2 hours as a surgical intervention.  The patient's history has been reviewed, patient examined, no change in status, stable for surgery.  I have reviewed the patient's chart and labs.  Questions were answered to the patient's satisfaction.     Julie Harrington

## 2021-04-12 NOTE — Discharge Instructions (Signed)
Activity As tolerated: NO showers for 3 days No heavy activities  Diet: Regular  Wound Care: Keep dressing clean & dry for 3 days.  Keep wrap applied with compression as much as possible.    Do not change dressings for 3 days unless soiled.  Can change if needed but make sure to reapply wrap. After three days can remove wrap and shower.  Then reapply dressings if needed and continue compression with wrap or soft sports bra. Call doctor if any unusual problems occur such as pain, excessive bleeding, unrelieved nausea/vomiting, fever &/or chills  Follow-up appointment: Scheduled for next week.    May take Tylenol after 3pm, if needed.   Post Anesthesia Home Care Instructions  Activity: Get plenty of rest for the remainder of the day. A responsible individual must stay with you for 24 hours following the procedure.  For the next 24 hours, DO NOT: -Drive a car -Advertising copywriter -Drink alcoholic beverages -Take any medication unless instructed by your physician -Make any legal decisions or sign important papers.  Meals: Start with liquid foods such as gelatin or soup. Progress to regular foods as tolerated. Avoid greasy, spicy, heavy foods. If nausea and/or vomiting occur, drink only clear liquids until the nausea and/or vomiting subsides. Call your physician if vomiting continues.  Special Instructions/Symptoms: Your throat may feel dry or sore from the anesthesia or the breathing tube placed in your throat during surgery. If this causes discomfort, gargle with warm salt water. The discomfort should disappear within 24 hours.  If you had a scopolamine patch placed behind your ear for the management of post- operative nausea and/or vomiting:  1. The medication in the patch is effective for 72 hours, after which it should be removed.  Wrap patch in a tissue and discard in the trash. Wash hands thoroughly with soap and water. 2. You may remove the patch earlier than 72 hours if you  experience unpleasant side effects which may include dry mouth, dizziness or visual disturbances. 3. Avoid touching the patch. Wash your hands with soap and water after contact with the patch.

## 2021-04-12 NOTE — Anesthesia Procedure Notes (Signed)
Procedure Name: Intubation Date/Time: 04/12/2021 9:58 AM Performed by: Willa Frater, CRNA Pre-anesthesia Checklist: Patient identified, Emergency Drugs available, Suction available and Patient being monitored Patient Re-evaluated:Patient Re-evaluated prior to induction Oxygen Delivery Method: Circle system utilized Preoxygenation: Pre-oxygenation with 100% oxygen Induction Type: IV induction Ventilation: Mask ventilation without difficulty, Oral airway inserted - appropriate to patient size and Two handed mask ventilation required Laryngoscope Size: Glidescope and 4 Grade View: Grade II Tube type: Oral Tube size: 7.0 mm Number of attempts: 3 (dl X 1 with glidescope; DL X 3 with mac 3 and 4  regular blad) Airway Equipment and Method: Stylet and Oral airway Placement Confirmation: ETT inserted through vocal cords under direct vision,  positive ETCO2 and breath sounds checked- equal and bilateral Secured at: 22 cm Tube secured with: Tape Dental Injury: Teeth and Oropharynx as per pre-operative assessment  Difficulty Due To: Difficulty was anticipated, Difficult Airway- due to anterior larynx and Difficult Airway- due to dentition

## 2021-04-12 NOTE — Brief Op Note (Signed)
04/12/2021  12:35 PM  PATIENT:  Charlyne Petrin  45 y.o. female  PRE-OPERATIVE DIAGNOSIS:  macromastia  POST-OPERATIVE DIAGNOSIS:  macromastia  PROCEDURE:  Procedure(s): MAMMARY REDUCTION  (BREAST) (Bilateral)  SURGEON:  Surgeon(s) and Role:    * Anye Brose, Wendy Poet, MD - Primary  PHYSICIAN ASSISTANT: Enedina Finner, RNFA  ASSISTANTS: none   ANESTHESIA:   general  EBL:  60 mL   BLOOD ADMINISTERED:none  DRAINS: Penrose drain in the bilateral breast   LOCAL MEDICATIONS USED:  MARCAINE     SPECIMEN:  Source of Specimen:  bilateral breast tissue  DISPOSITION OF SPECIMEN:  PATHOLOGY  COUNTS:  YES  TOURNIQUET:  * No tourniquets in log *  DICTATION: .Dragon Dictation  PLAN OF CARE: Discharge to home after PACU  PATIENT DISPOSITION:  PACU - hemodynamically stable.   Delay start of Pharmacological VTE agent (>24hrs) due to surgical blood loss or risk of bleeding: not applicable

## 2021-04-12 NOTE — Op Note (Signed)
Operative Note   DATE OF OPERATION: 04/12/2021  LOCATION:  [facility]   SURGICAL DEPARTMENT: Plastic Surgery  PREOPERATIVE DIAGNOSES: Bilateral symptomatic macromastia.  POSTOPERATIVE DIAGNOSES:  same  PROCEDURE: Bilateral breast reduction with superomedial pedicle.  SURGEON: Ancil Linsey, MD  ASSISTANT: Enedina Finner, RNFA The advanced practice practitioner (APP) assisted throughout the case.  The APP was essential in retraction and counter traction when needed to make the case progress smoothly.  This retraction and assistance made it possible to see the tissue plans for the procedure.  The assistance was needed for blood control, tissue re-approximation and assisted with closure of the incision site.  ANESTHESIA: General.  COMPLICATIONS: None.   INDICATIONS FOR PROCEDURE:  The patient, Julie Harrington is a 45 y.o. female born on 1976-10-26, is here for treatment of bilateral symptomatic macromastia. MRN: [id number]  CONSENT:  Informed consent was obtained directly from the patient. Risks, benefits and alternatives were fully discussed. Specific risks including but not limited to bleeding, infection, hematoma, seroma, scarring, pain, infection, contracture, asymmetry, wound healing problems, and need for further surgery were all discussed. The patient did have an ample opportunity to have questions answered to satisfaction.   DESCRIPTION OF PROCEDURE:  The patient was marked preoperatively for a Wise pattern skin excision.  The patient was taken to the operating room. SCDs were placed and antibiotics were given. General anesthesia was administered.The patient's operative site was prepped and draped in a sterile fashion. A time out was performed and all information was confirmed to be correct.  Right Breast: The breast was infiltrated with tumescent solution to help with hemostasis.  The nipple was marked with a cookie cutter.  A superomedial pedicle was drawn out with  the base of at least 8 cm in size.  A breast tourniquet was then applied and the pedicle was de-epithelialized.  Breast tourniquet was then let down and all incisions were made with a 10 blade.  The pedicle was then isolated down to the chest wall with cautery and the excision was performed removing tissue primarily inferiorly and laterally.  Hemostasis was obtained and the wound was stapled closed.  Left breast:  The breast was infiltrated with tumescent solution to help with hemostasis.  The nipple was marked with a cookie cutter.  A superomedial pedicle was drawn out with the base of at least 8 cm in size.  A breast tourniquet was then applied and the pedicle was de-epithelialized.  Breast tourniquet was then let down and all incisions were made with a 10 blade.  The pedicle was then isolated down to the chest wall with cautery and the excision was performed removing tissue primarily inferiorly and laterally.  Hemostasis was obtained and the wound was stapled closed.  Patient was then set up to check for size and symmetry.  Minor modifications were made.  This resulted in a total of 1118g removed from the right side and 1372g removed from the left side.  The inframammary incision was closed with a combination of buried in-sorb staples and a running 3-0 Quill suture.  The vertical and periareolar limbs were closed with interrupted buried 4-0 Monocryl and a running 4-0 Quill suture.  Steri-Strips were then applied along with a soft dressing and Ace wrap.  The patient tolerated the procedure well.  There were no complications. The patient was allowed to wake from anesthesia, extubated and taken to the recovery room in satisfactory condition.  I was present for the entire procedure.

## 2021-04-13 ENCOUNTER — Encounter (HOSPITAL_BASED_OUTPATIENT_CLINIC_OR_DEPARTMENT_OTHER): Payer: Self-pay | Admitting: Plastic Surgery

## 2021-04-14 LAB — SURGICAL PATHOLOGY

## 2021-04-20 ENCOUNTER — Other Ambulatory Visit: Payer: Self-pay

## 2021-04-20 ENCOUNTER — Ambulatory Visit (INDEPENDENT_AMBULATORY_CARE_PROVIDER_SITE_OTHER): Payer: 59 | Admitting: Plastic Surgery

## 2021-04-20 DIAGNOSIS — N62 Hypertrophy of breast: Secondary | ICD-10-CM

## 2021-04-20 DIAGNOSIS — Z719 Counseling, unspecified: Secondary | ICD-10-CM

## 2021-04-20 NOTE — Progress Notes (Signed)
Patient presents 1 week postop from bilateral breast reduction.  Overall she feels like things are going well.  On exam all of her incisions are healing nicely.  There is a little bit of bruising which is not unexpected.  She has good shape size and symmetry and the nipple areolar complexes are viable.  All of her questions were answered.  We will have her maintain compressive garments and avoid strenuous activity.  We will see her again in a few weeks.

## 2021-04-22 ENCOUNTER — Telehealth: Payer: Self-pay | Admitting: Surgical

## 2021-04-22 NOTE — Telephone Encounter (Signed)
Patient called to say that she had a smell coming from her drains and she had yellowish discharge on one side and bright red on the other. Spoke with [place], [place], and he advised this is normal. This was relayed to the patient. Advised patient to call us if she has any issues between now and her next follow up to call the office. Patient stated understanding.

## 2021-04-25 ENCOUNTER — Telehealth: Payer: Self-pay | Admitting: Surgical

## 2021-04-25 ENCOUNTER — Telehealth: Payer: Self-pay

## 2021-04-25 NOTE — Telephone Encounter (Signed)
Patient called to say that she had surgery on 04/12/2021.  Patient said the tubing that drains the fluid has come out and fluid is coming out.  Please call.

## 2021-04-25 NOTE — Telephone Encounter (Signed)
Patient called to advise that she fell asleep on her tube Saturday night in her chair and when she woke up, she realized she had been leaning on her tube and there was a lot of blood. There is no blood now, no pain or any drainage. Patient understands to call us if something changes between now and her next appointment. She wanted to let us know in case we need to see her before then to check.

## 2021-04-25 NOTE — Telephone Encounter (Signed)
Returned patients call. While removing the dressing the penrose drain came out from the left side. Currently it is draining clear fluid. Denies any fever, chills, nausea, vomiting, redness, nor odor. The right drain is still intact and still bleeding some. Advised patient to apply Vaseline, cover with gauze and use medipore tape to secure. Change dressing as needed. Patient understood an agreed with plan.

## 2021-04-25 NOTE — Telephone Encounter (Signed)
Returned patients call. The current color of the blood is now dark red and has stopped bleeding a lot since Saturday night. Per [name], since she is no longer bleeding heavy continue cover the tube with guaze and change dressing as needed. Follow up at the next appointment 05/10/21 1:20. Call our office back should anything change. Patient understood and agreed with plan.

## 2021-04-27 ENCOUNTER — Telehealth: Payer: Self-pay

## 2021-04-27 NOTE — Telephone Encounter (Signed)
Patient called to let us know that last night the tubing on her right side came out.  Please call.

## 2021-04-27 NOTE — Telephone Encounter (Signed)
Returned patients call. She indicated the left penrose drain fell out with hardly any drainage. Advised patient to apply Vaseline and cover with gauze, change as needed.Will see her at her PO follow up on 05/10/2021. Patient understood and agreed with plan.

## 2021-05-03 ENCOUNTER — Encounter: Payer: 59 | Admitting: Surgical

## 2021-05-10 ENCOUNTER — Ambulatory Visit (INDEPENDENT_AMBULATORY_CARE_PROVIDER_SITE_OTHER): Payer: 59 | Admitting: Surgical

## 2021-05-10 ENCOUNTER — Other Ambulatory Visit: Payer: Self-pay

## 2021-05-10 DIAGNOSIS — N62 Hypertrophy of breast: Secondary | ICD-10-CM

## 2021-05-10 NOTE — Progress Notes (Signed)
Patient is a 45 year old female here for follow-up after bilateral breast reduction on 04/12/2021 with Dr. Arita Miss.  She is 4 weeks postop.  Patient reports overall she is doing well.  She reports no fevers.  She reports that she has noticed that her bilateral breasts have softened up over the past few weeks.  She reports that she is still having a little bit of drainage from the left lateral breast where she had a small opening.  She has some questions about when to return to work.  Chaperone present on exam On exam bilateral NAC's are viable.  She does have a few small wounds noted on exam.  She has 2 wounds on the right NAC at 1 and 6:00.  They are superficial.  No surrounding erythema.  No cellulitic changes.  She has a wound at the inframammary fold and vertical limb junction on the right breast that has a good base of granulation tissue and is approximately 3 x 3 mm.  Along the left breast she has a left lateral breast wound that is approximately 2 x 2 mm without any surrounding erythema.  No purulent drainage noted.  She has a few small wounds around the left NAC as well.  No subcutaneous fluid collection noted palpation.  No bruising noted.   Recommend Vaseline and gauze to the breast wounds daily.  Recommend continue to avoid strenuous activities or heavy lifting.  Recommend continue to wear compressive garment 24/7. 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  At that time she can return to work with no restrictions.  Recommend calling with questions or concerns.  There is no sign of infection, seroma, hematoma

## 2021-05-31 ENCOUNTER — Other Ambulatory Visit: Payer: Self-pay

## 2021-05-31 ENCOUNTER — Ambulatory Visit (INDEPENDENT_AMBULATORY_CARE_PROVIDER_SITE_OTHER): Payer: 59 | Admitting: Surgical

## 2021-05-31 ENCOUNTER — Ambulatory Visit: Payer: 59 | Admitting: Surgical

## 2021-05-31 DIAGNOSIS — N62 Hypertrophy of breast: Secondary | ICD-10-CM

## 2021-05-31 NOTE — Progress Notes (Signed)
Patient is a 45 year old female here for follow-up after bilateral breast reduction with Dr. Arita Miss on 04/12/2021.  She is 7 weeks postop.  She is overall doing well.  She has no complaints other than some tenderness along the bilateral axilla region.  Chaperone present on exam On exam bilateral NAC's are viable.  Bilateral breast incisions are healing well.  She does have a few small wounds noted.  The left lateral breast incision in the left NAC along the inferior portion have small wounds with no foul odor or purulent drainage noted.  She has a suture protruding through the skin on the right inframammary fold.  No erythema.  No cellulitic changes noted.  Nontender to palpation.  Recommend Vaseline and gauze to bilateral breast incisions daily.  There is no sign of infection, seroma, hematoma.  Recommend following up in 1 month for reevaluation.  Call with questions or concerns.

## 2021-06-05 IMAGING — CR DG NECK SOFT TISSUE
2 series · 2 of 2 positions shown · non-contrast
Comparison: None.

CLINICAL DATA: Recent hives with sore throat this morning and uvula
swelling.

EXAM:
NECK SOFT TISSUES - 1+ VIEW

[neck lat]
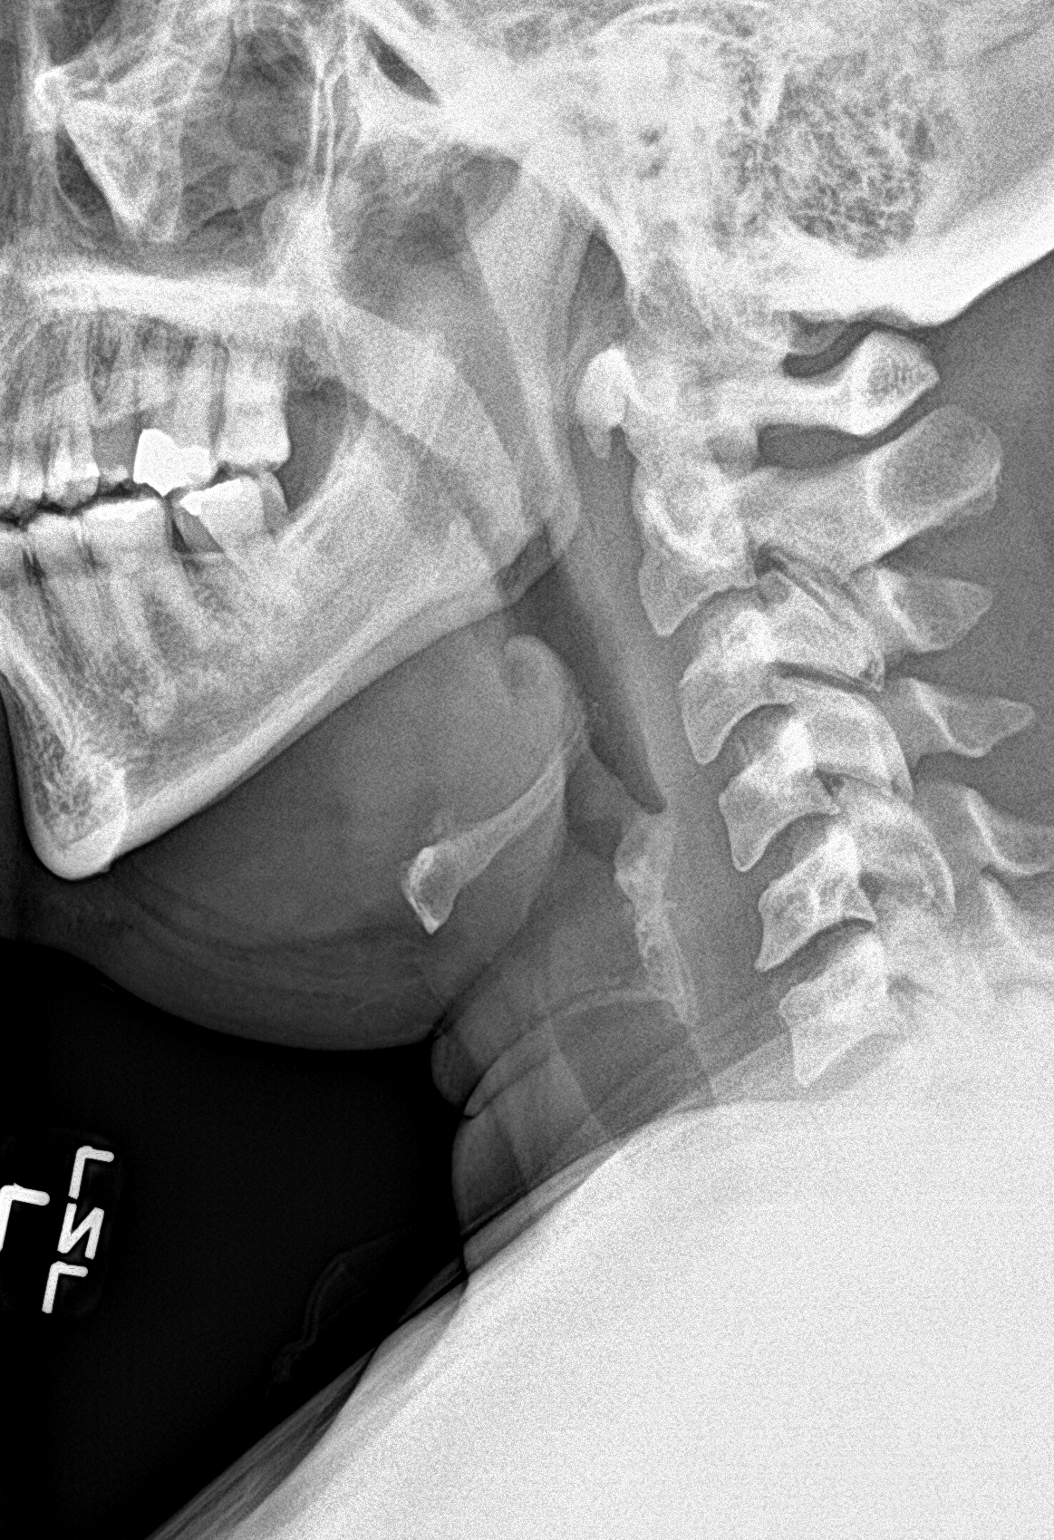

[neck ap]
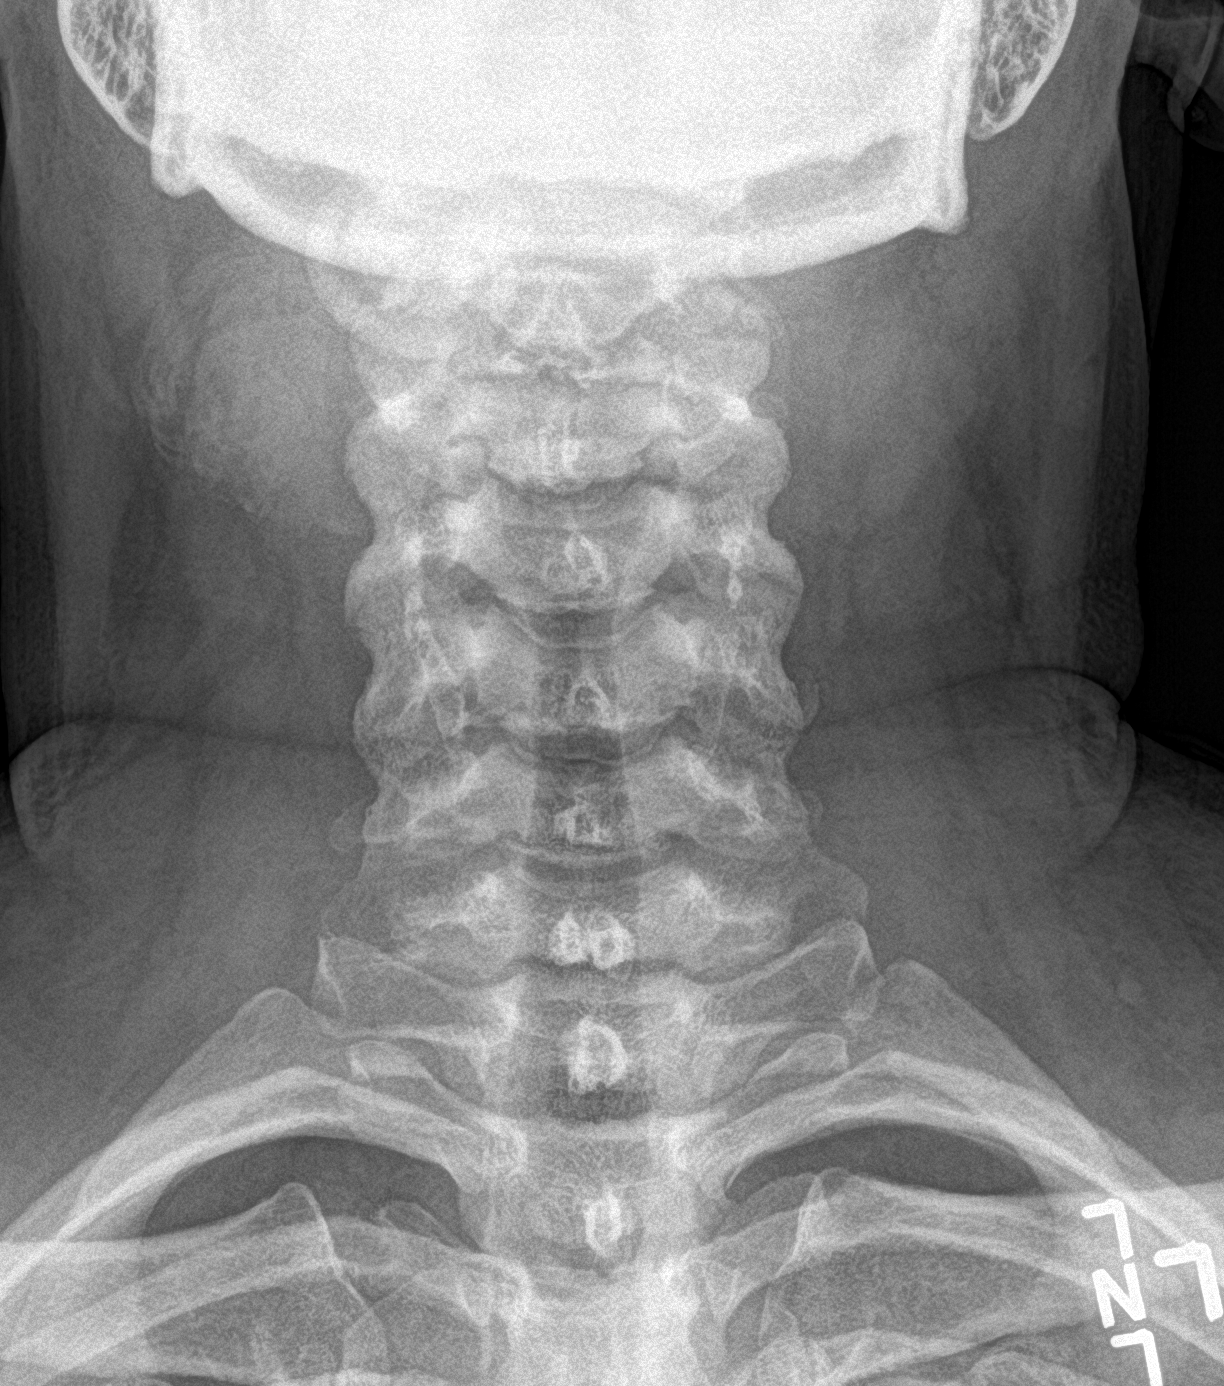

[2 of 2 positions shown; findings below may reference images not displayed]

FINDINGS: Possible lower epiglottis thickening, but this could be related to
coaptation. No retropharyngeal soft tissue thickening. The larynx
appears unremarkable. No osseous findings.
IMPRESSION: 1. Epiglottis thickening versus coaptation with the tongue base.
2. The airway remains patent.

## 2021-06-09 IMAGING — CT CT NECK W/ CM
5 of 6 series · 14 of 33 positions shown, 16 images · IV contrast (APPLIED)
Comparison: Prior radiograph from 03/16/2020.

CLINICAL DATA: Initial evaluation for acute neck pain, infection or
inflammation suspected.

EXAM:
CT NECK WITH CONTRAST
TECHNIQUE: Multidetector CT imaging of the neck was performed using the
standard protocol following the bolus administration of intravenous
contrast.
CONTRAST:  75mL OMNIPAQUE IOHEXOL 300 MG/ML  SOLN

[Series 3: axial neck · axial · 0.55mm/px · z∈[-213,-135]mm · 2 of 119 slices shown, 3 images]
[im 40/119  soft-tissue]
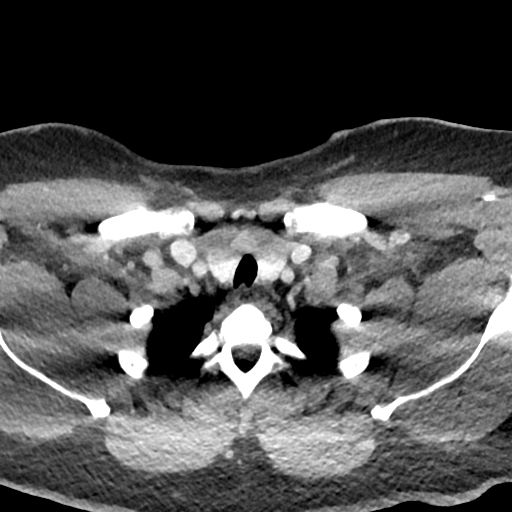
[im 40/119  bone]
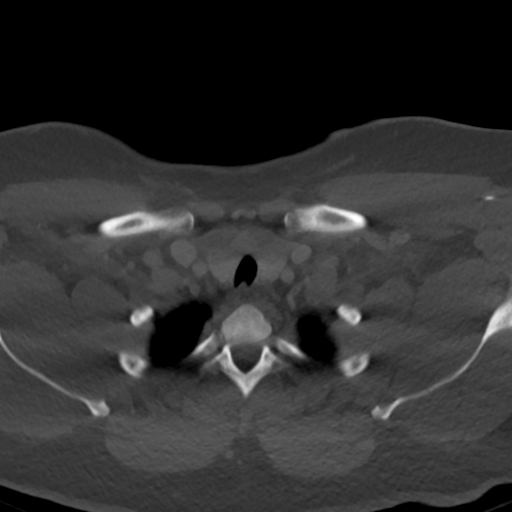
[im 79/119  bone]
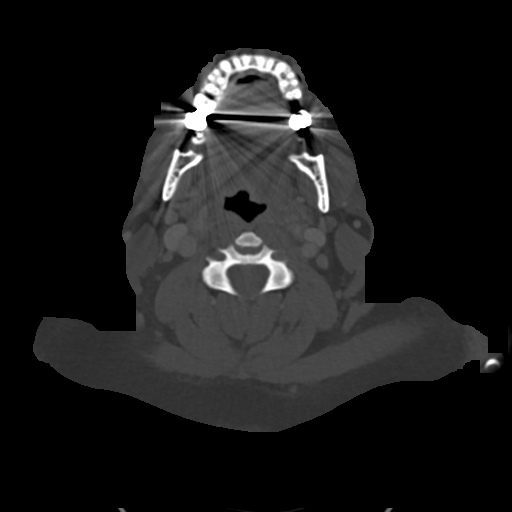

[Series 4: axial bone · axial · 0.55mm/px · z∈[-213,-135]mm · 2 of 119 slices shown]
[im 40/119  bone]
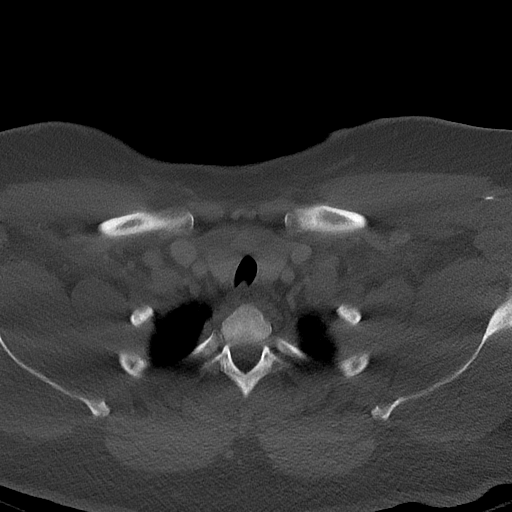
[im 79/119  bone]
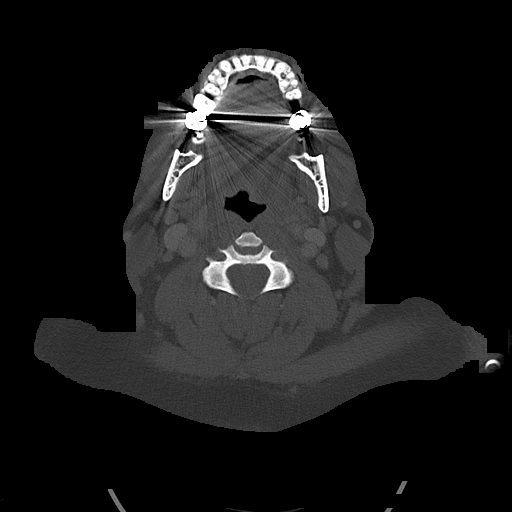

[Series 6: sag neck · sagittal · 0.47mm/px · 5 of 92 slices shown, 6 images]
[im 31/92  bone]
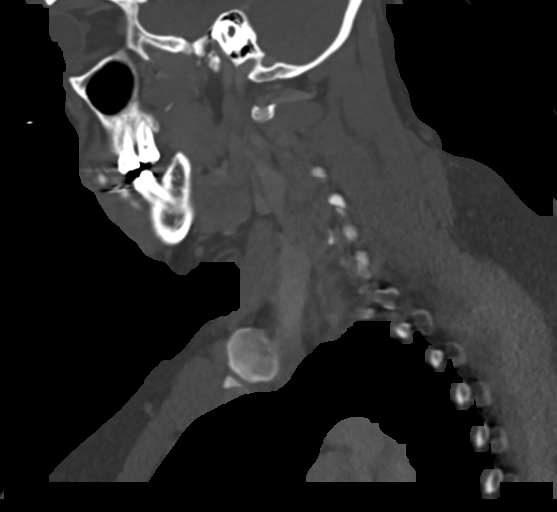
[im 38/92  bone]
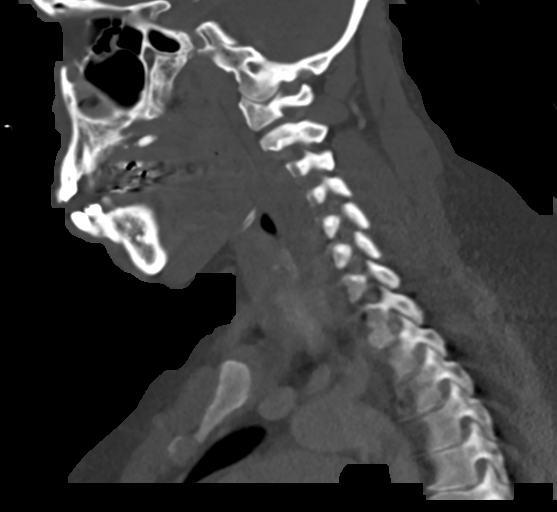
[im 46/92  soft-tissue]
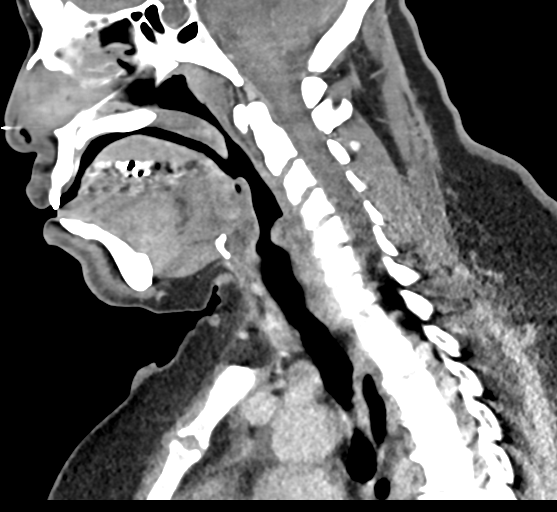
[im 46/92  bone]
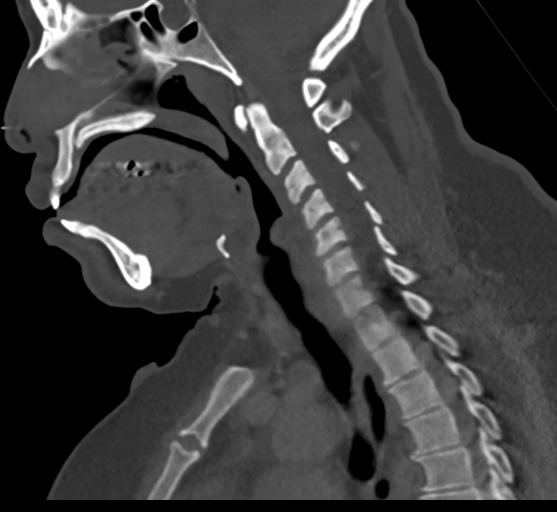
[im 54/92  bone]
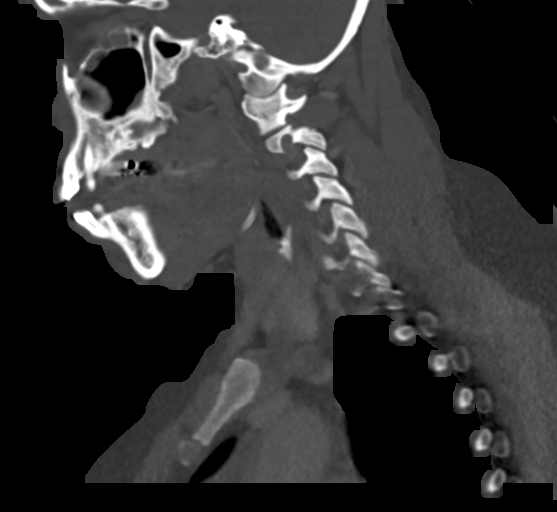
[im 61/92  bone]
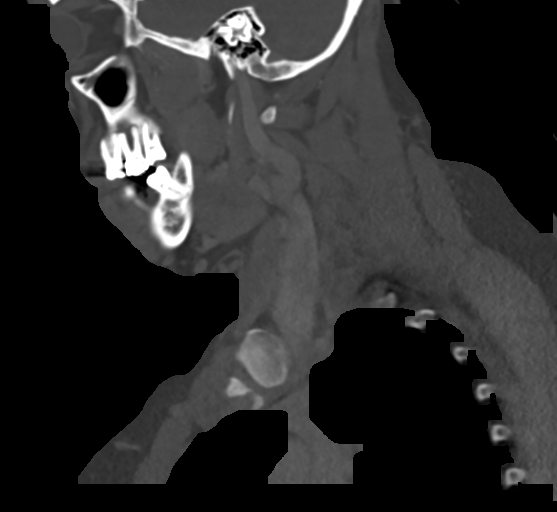

[Series 7: cor neck · coronal · 0.37mm/px · 3 of 134 slices shown]
[im 27/134  bone]
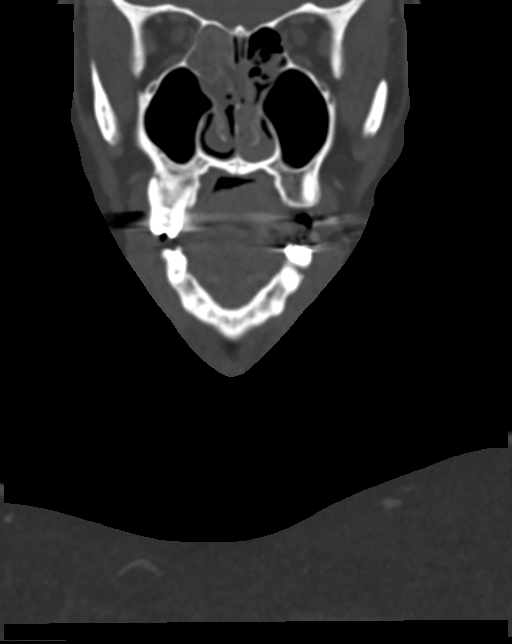
[im 54/134  bone]
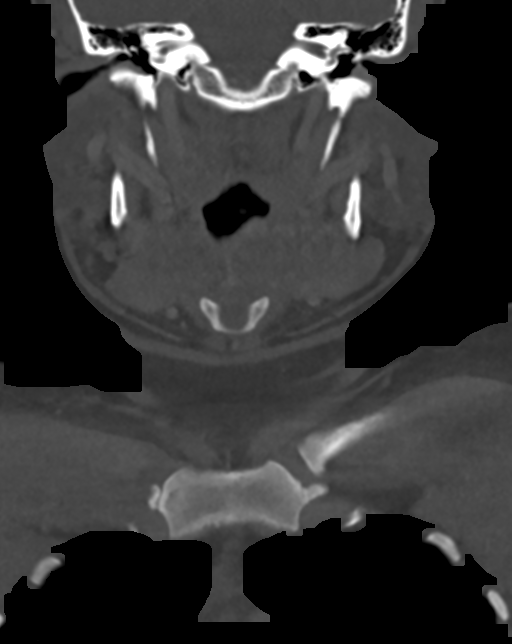
[im 80/134  bone]
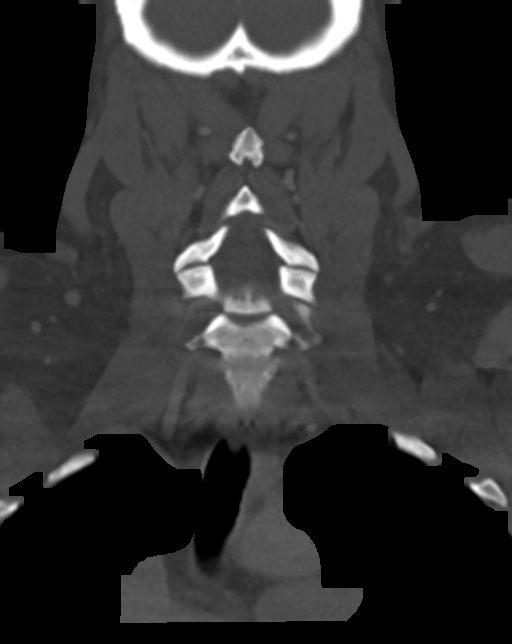

[Series 8: ax oropharynx · axial · 0.40mm/px · z∈[-213,-133]mm · 2 of 120 slices shown]
[im 40/120  bone]
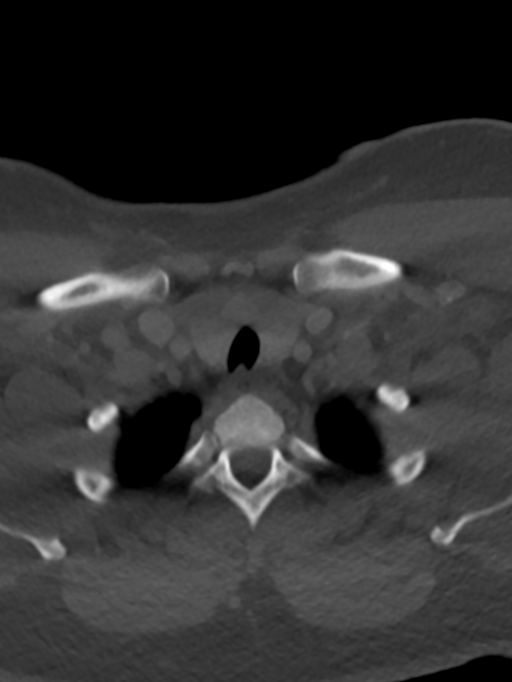
[im 80/120  bone]
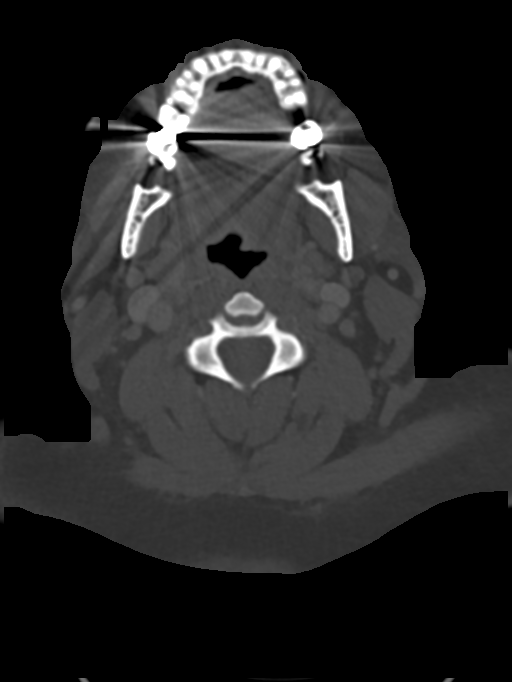

[14 of 33 positions shown; findings below may reference images not displayed]

FINDINGS: Pharynx and larynx: Oral cavity within normal limits without
discrete mass or loculated fluid collection. No acute inflammatory
changes seen about the dentition. Palatine tonsils fairly symmetric
and within normal limits. No findings to suggest acute tonsillitis.
No tonsillar or peritonsillar abscess. Parapharyngeal fat
maintained. Nasopharynx within normal limits. No retropharyngeal
collection. Epiglottis within normal limits. Vallecula clear.
Remainder of the hypopharynx and supraglottic larynx within normal
limits. True cords symmetric and normal. Subglottic airway clear.

Salivary glands: Salivary glands including the parotid and
submandibular glands within normal limits.

Thyroid: Probable 11 mm hypodense right thyroid nodule (series 3,
image 72), of doubtful significance given size and patient age. No
follow-up imaging recommended regarding this lesion. Thyroid
otherwise unremarkable.

Lymph nodes: No pathologically enlarged lymph nodes seen within the
neck.

Vascular: Normal intravascular enhancement seen throughout the neck.

Limited intracranial: Unremarkable.

Visualized orbits: Globes and orbital soft tissues within normal
limits.

Mastoids and visualized paranasal sinuses: Chronic opacification of
the right frontal and ethmoidal air cells, with scattered mucosal
thickening within the right greater than left maxillary sinuses.
Small volume layering fluid noted within the left sphenoid sinus.
Mastoid air cells and middle ear cavities are well pneumatized and
free of fluid.

Skeleton: No acute osseous abnormality. No discrete or worrisome
osseous lesions.

Upper chest: Visualized upper chest demonstrates no acute finding.
Few small subcentimeter nodules measuring up to 4 mm seen within the
parasagittal left upper lobe (series 5, images 56, 54). Visualized
lungs are otherwise clear.

Other: None.
IMPRESSION: 1. Negative CT of the neck. No acute inflammatory changes or other
abnormality identified.
2. Chronic right-sided paranasal sinus disease, likely inflammatory.
3. Two small 4 mm left upper lobe nodules, indeterminate. No
follow-up needed if patient is low-risk (and has no known or
suspected primary neoplasm). Non-contrast chest CT can be considered
in 12 months if patient is high-risk. This recommendation follows
the consensus statement: Guidelines for Management of Incidental
Pulmonary Nodules Detected on CT Images: From the [HOSPITAL]

## 2021-06-29 ENCOUNTER — Ambulatory Visit (INDEPENDENT_AMBULATORY_CARE_PROVIDER_SITE_OTHER): Payer: 59 | Admitting: Plastic Surgery

## 2021-06-29 ENCOUNTER — Other Ambulatory Visit: Payer: Self-pay

## 2021-06-29 DIAGNOSIS — N62 Hypertrophy of breast: Secondary | ICD-10-CM

## 2021-06-29 NOTE — Progress Notes (Signed)
Patient presents postop from bilateral breast reduction.  She is overall happy.  She feels like along the left nipple areolar complex there may be some small wounds.  On examination all of her incisions have healed well with the exception of a few small areas around the nipple areolar complex on the left side.  I tried to look for any extruding sutures but what is unable to find any of substance.  I suspect that most of those have dissolved by now and we will plan to see her again in a few weeks to check her progress.  Overall things are going well.

## 2021-07-27 ENCOUNTER — Ambulatory Visit: Payer: 59

## 2022-05-04 ENCOUNTER — Other Ambulatory Visit: Payer: Self-pay | Admitting: Surgical

## 2022-05-04 DIAGNOSIS — Z1231 Encounter for screening mammogram for malignant neoplasm of breast: Secondary | ICD-10-CM

## 2022-05-19 ENCOUNTER — Ambulatory Visit
Admission: RE | Admit: 2022-05-19 | Discharge: 2022-05-19 | Disposition: A | Discharge status: Home / Self Care | Payer: BLUE CROSS/BLUE SHIELD | Source: Ambulatory Visit | Attending: Surgical | Admitting: Surgical

## 2022-05-19 DIAGNOSIS — Z1231 Encounter for screening mammogram for malignant neoplasm of breast: Secondary | ICD-10-CM

## 2022-12-12 DIAGNOSIS — H6590 Unspecified nonsuppurative otitis media, unspecified ear: Secondary | ICD-10-CM | POA: Diagnosis not present

## 2022-12-12 DIAGNOSIS — Z111 Encounter for screening for respiratory tuberculosis: Secondary | ICD-10-CM | POA: Diagnosis not present

## 2022-12-12 DIAGNOSIS — I10 Essential (primary) hypertension: Secondary | ICD-10-CM | POA: Diagnosis not present

## 2022-12-12 DIAGNOSIS — Z13220 Encounter for screening for lipoid disorders: Secondary | ICD-10-CM | POA: Diagnosis not present

## 2022-12-12 DIAGNOSIS — Z0000 Encounter for general adult medical examination without abnormal findings: Secondary | ICD-10-CM | POA: Diagnosis not present

## 2023-04-25 DIAGNOSIS — F4310 Post-traumatic stress disorder, unspecified: Secondary | ICD-10-CM | POA: Diagnosis not present

## 2023-04-25 DIAGNOSIS — F315 Bipolar disorder, current episode depressed, severe, with psychotic features: Secondary | ICD-10-CM | POA: Diagnosis not present

## 2023-08-27 DIAGNOSIS — G4733 Obstructive sleep apnea (adult) (pediatric): Secondary | ICD-10-CM | POA: Diagnosis not present

## 2023-09-27 DIAGNOSIS — F315 Bipolar disorder, current episode depressed, severe, with psychotic features: Secondary | ICD-10-CM | POA: Diagnosis not present

## 2023-09-27 DIAGNOSIS — F4310 Post-traumatic stress disorder, unspecified: Secondary | ICD-10-CM | POA: Diagnosis not present

## 2023-10-18 DIAGNOSIS — G4733 Obstructive sleep apnea (adult) (pediatric): Secondary | ICD-10-CM | POA: Diagnosis not present

## 2024-01-04 DIAGNOSIS — F315 Bipolar disorder, current episode depressed, severe, with psychotic features: Secondary | ICD-10-CM | POA: Diagnosis not present

## 2024-01-31 DIAGNOSIS — Z124 Encounter for screening for malignant neoplasm of cervix: Secondary | ICD-10-CM | POA: Diagnosis not present

## 2024-01-31 DIAGNOSIS — N949 Unspecified condition associated with female genital organs and menstrual cycle: Secondary | ICD-10-CM | POA: Diagnosis not present

## 2024-01-31 DIAGNOSIS — I10 Essential (primary) hypertension: Secondary | ICD-10-CM | POA: Diagnosis not present

## 2024-01-31 DIAGNOSIS — N841 Polyp of cervix uteri: Secondary | ICD-10-CM | POA: Diagnosis not present

## 2024-02-01 DIAGNOSIS — Z1231 Encounter for screening mammogram for malignant neoplasm of breast: Secondary | ICD-10-CM | POA: Diagnosis not present

## 2024-02-01 DIAGNOSIS — F315 Bipolar disorder, current episode depressed, severe, with psychotic features: Secondary | ICD-10-CM | POA: Diagnosis not present

## 2024-02-01 LAB — HM MAMMOGRAPHY

## 2024-02-04 LAB — HM PAP SMEAR
HM Pap smear: POSITIVE
HPV, high-risk: NEGATIVE

## 2024-02-22 ENCOUNTER — Encounter: Payer: Self-pay | Admitting: Internal Medicine

## 2024-02-22 ENCOUNTER — Ambulatory Visit: Payer: BLUE CROSS/BLUE SHIELD | Attending: Internal Medicine | Admitting: Internal Medicine

## 2024-02-22 VITALS — BP 126/76 | HR 57 | Temp 98.0°F | Ht 66.0 in | Wt 239.0 lb

## 2024-02-22 DIAGNOSIS — E66812 Obesity, class 2: Secondary | ICD-10-CM

## 2024-02-22 DIAGNOSIS — Z1211 Encounter for screening for malignant neoplasm of colon: Secondary | ICD-10-CM

## 2024-02-22 DIAGNOSIS — F3178 Bipolar disorder, in full remission, most recent episode mixed: Secondary | ICD-10-CM

## 2024-02-22 DIAGNOSIS — G4733 Obstructive sleep apnea (adult) (pediatric): Secondary | ICD-10-CM | POA: Diagnosis not present

## 2024-02-22 DIAGNOSIS — F17200 Nicotine dependence, unspecified, uncomplicated: Secondary | ICD-10-CM

## 2024-02-22 DIAGNOSIS — Z1159 Encounter for screening for other viral diseases: Secondary | ICD-10-CM

## 2024-02-22 DIAGNOSIS — Z7689 Persons encountering health services in other specified circumstances: Secondary | ICD-10-CM

## 2024-02-22 DIAGNOSIS — F17210 Nicotine dependence, cigarettes, uncomplicated: Secondary | ICD-10-CM | POA: Diagnosis not present

## 2024-02-22 DIAGNOSIS — Z6838 Body mass index (BMI) 38.0-38.9, adult: Secondary | ICD-10-CM

## 2024-02-22 DIAGNOSIS — Z2821 Immunization not carried out because of patient refusal: Secondary | ICD-10-CM

## 2024-02-22 DIAGNOSIS — Z114 Encounter for screening for human immunodeficiency virus [HIV]: Secondary | ICD-10-CM

## 2024-02-22 DIAGNOSIS — I10 Essential (primary) hypertension: Secondary | ICD-10-CM | POA: Diagnosis not present

## 2024-02-22 DIAGNOSIS — F909 Attention-deficit hyperactivity disorder, unspecified type: Secondary | ICD-10-CM | POA: Insufficient documentation

## 2024-02-22 MED ORDER — AMLODIPINE BESYLATE 10 MG PO TABS: 10.0000 mg | ORAL_TABLET | Freq: Every day | ORAL | 1 refills | Status: DC

## 2024-02-22 NOTE — Patient Instructions (Signed)

## 2024-02-22 NOTE — Progress Notes (Signed)
 Patient ID: Julie Harrington, female    DOB: Jul 01, 1976  MRN: [id number]  CC: Establish Care (Est care / new pt. Med refill. /No questions / concerns/Instructed to sign ROI for pap. )   Subjective: Julie Harrington is a 48 y.o. female who presents for new pt visit. Her concerns today include:  Pt with hx of HTN, OSA, Bipolar disorder, tob dep, obesity  Discussed the use of AI scribe software for clinical note transcription with the patient, who gave verbal consent to proceed.  History of Present Illness   Julie Harrington, a 48 year old patient with a history of hypertension and sleep apnea, presents for a new patient visit after being dismissed from her previous primary care provider at [facility] due to missed appointments. She reports adherence to her current antihypertensive medication, amlodipine 10 mg daily, and denies any symptoms of chest pain, shortness of breath, or leg swelling. However, she notes darkening of her ankles.   OSA:  She uses a CPAP machine for her sleep apnea approximately four times a week and denies any daytime sleepiness or morning headaches. She acknowledges the need for more consistent use of her CPAP machine.  Bipolar/ADHD: Pt gives hx of manic depressive ds, and ADHD. She is currently on risperidone 3mg  twice daily and a med for ADHD through [place]. She reports feeling stable on her current psychiatric medications.  Tob Dep:  currently smoking about 1 pack/day since the age of 82. She has previously quit for three months but resumed due to weight gain. She expresses a desire to quit again but wants to lose weight first.   Obesity:  She acknowledges poor dietary habits, including a high intake of junk food, and plans to start a diet in the near future. 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  Does a lot of walking, pushing, pulling and lifting on her job.  I see asthma on her chart.  However the patient denies any history of  asthma.    HM: Reports having had Pap smear recently by GYN Dr. Cherly Hensen.  Due for colon cancer screening.  Never had colonoscopy.  No family history of colon cancer.  Agreeable to being screen for HIV and hepatitis C.  Thinks she had Tdap about 3 years ago.  Current Outpatient Medications on File Prior to Visit  Medication Sig Dispense Refill   risperiDONE (RISPERDAL) 0.25 MG tablet Take 6 mg by mouth at bedtime.     buPROPion (WELLBUTRIN SR) 150 MG 12 hr tablet Take 150 mg by mouth 2 (two) times daily. (Patient not taking: Reported on 02/22/2024)     No current facility-administered medications on file prior to visit.    Allergies  Allergen Reactions   Latex Rash    Social History   Socioeconomic History   Marital status: Single    Spouse name: Not on file   Number of children: 2   Years of education: Not on file   Highest education level: Associate degree: occupational, Scientist, product/process development, or vocational program  Occupational History   Occupation: CMA at [employer]  Tobacco Use   Smoking status: Former    Current packs/day: 1.00    Average packs/day: 1 pack/day for 20.0 years (20.0 ttl pk-yrs)    Types: Cigarettes   Smokeless tobacco: Never   Tobacco comments:    quit 12/2020  Vaping Use   Vaping status: Never Used  Substance and Sexual Activity   Alcohol use: Yes   Drug  use: No   Sexual activity: Not on file  Other Topics Concern   Not on file  Social History Narrative   Not on file   Social Drivers of Health   Financial Resource Strain: Low Risk  (02/22/2024)   Overall Financial Resource Strain (CARDIA)    Difficulty of Paying Living Expenses: Not hard at all  Food Insecurity: No Food Insecurity (02/22/2024)   Hunger Vital Sign    Worried About Running Out of Food in the Last Year: Never true    Ran Out of Food in the Last Year: Never true  Transportation Needs: No Transportation Needs (02/22/2024)   PRAPARE - Administrator, Civil Service (Medical): No     Lack of Transportation (Non-Medical): No  Physical Activity: Inactive (02/22/2024)   Exercise Vital Sign    Days of Exercise per Week: 0 days    Minutes of Exercise per Session: 0 min  Stress: No Stress Concern Present (02/22/2024)   [employer] of Occupational Health - Occupational Stress Questionnaire    Feeling of Stress : Not at all  Social Connections: Moderately Isolated (02/22/2024)   Social Connection and Isolation Panel [NHANES]    Frequency of Communication with Friends and Family: Twice a week    Frequency of Social Gatherings with Friends and Family: Twice a week    Attends Religious Services: More than 4 times per year    Active Member of [employer] or Organizations: No    Attends Banker Meetings: Never    Marital Status: Never married  Intimate Partner Violence: Not At Risk (02/22/2024)   Humiliation, Afraid, Rape, and Kick questionnaire    Fear of Current or Ex-Partner: No    Emotionally Abused: No    Physically Abused: No    Sexually Abused: No    Family History  Problem Relation Age of Onset   Hypertension Other    Diabetes Other     Past Surgical History:  Procedure Laterality Date   BREAST REDUCTION SURGERY Bilateral 04/12/2021   Procedure: MAMMARY REDUCTION  (BREAST);  Surgeon: [name], MD;  Location: [facility];  Service: Plastics;  Laterality: Bilateral;   CHOLECYSTECTOMY     REDUCTION MAMMAPLASTY      ROS: Review of Systems Negative except as stated above  PHYSICAL EXAM: BP 126/76 (BP Location: Left Arm, Patient Position: Sitting, Cuff Size: Normal)   Pulse (!) 57   Temp 98 F (36.7 C) (Oral)   Ht 5\' 6"  (1.676 m)   Wt 239 lb (108.4 kg)   SpO2 96%   BMI 38.58 kg/m   Physical Exam  General appearance - alert, well appearing, middle-age African-American female and in no distress Mental status -patient with flat affect and soft-spoken.  She answers questions appropriately. Eyes - pupils equal and reactive,  extraocular eye movements intact Neck - supple, no significant adenopathy Chest - clear to auscultation, no wheezes, rales or rhonchi, symmetric air entry Heart - normal rate, regular rhythm, normal S1, S2, no murmurs, rubs, clicks or gallops Extremities - peripheral pulses normal, no pedal edema, no clubbing or cyanosis      Latest Ref Rng & Units 04/02/2020   10:57 AM 03/19/2020   11:30 PM 03/16/2020    4:28 AM  CMP  Glucose 65 - 139 mg/dL 952  841  324   BUN 7 - 25 mg/dL 10  16  10    Creatinine 0.50 - 1.10 mg/dL 4.01  0.27  2.53  Sodium 135 - 146 mmol/L 143  145  141   Potassium 3.5 - 5.3 mmol/L 3.8  3.7  3.7   Chloride 98 - 110 mmol/L 104  107  106   CO2 20 - 32 mmol/L 31   26   Calcium 8.6 - 10.2 mg/dL 9.7   8.9   Total Protein 6.1 - 8.1 g/dL 6.6     Total Bilirubin 0.2 - 1.2 mg/dL 0.6     AST 10 - 30 U/L 28     ALT 6 - 29 U/L 64      Lipid Panel  No results found for: "CHOL", "TRIG", "HDL", "CHOLHDL", "VLDL", "LDLCALC", "LDLDIRECT"  CBC    Component Value Date/Time   WBC 10.8 04/02/2020 1057   RBC 4.15 04/02/2020 1057   HGB 12.5 04/02/2020 1057   HCT 38.4 04/02/2020 1057   PLT 225 04/02/2020 1057   MCV 92.5 04/02/2020 1057   MCH 30.1 04/02/2020 1057   MCHC 32.6 04/02/2020 1057   RDW 13.1 04/02/2020 1057   LYMPHSABS 1,652 04/02/2020 1057   MONOABS 0.6 03/16/2020 0428   EOSABS 248 04/02/2020 1057   BASOSABS 22 04/02/2020 1057    ASSESSMENT AND PLAN: 1. Establishing care with new doctor, encounter for (Primary)   2. Essential hypertension At goal.  Continue Norvasc 10 mg daily. - CBC - Comprehensive metabolic panel - Lipid panel  3. OSA on CPAP Encouraged her to use her CPAP consistently every night.  Uncontrolled sleep apnea can affect blood pressure.  4. Tobacco dependence Strongly encouraged her to quit.  Discussed health risks associated with smoking.  Patient not ready to give a trial of quitting stating that she wants to work on getting her weight  down first.  5. Class 2 severe obesity due to excess calories with serious comorbidity and body mass index (BMI) of 38.0 to 38.9 in adult [facility]) Patient advised to eliminate sugary drinks from the diet, cut back on portion sizes especially of white carbohydrates, eat more white lean meat like chicken [place] and seafood instead of beef or pork and incorporate fresh fruits and vegetables into the diet daily. Encouraged her to try to get in some exercise outside of work at least 3 to 5 days a week for 30 minutes. - Amb ref to Medical Nutrition Therapy-MNT  6. Bipolar disorder, in full remission, most recent episode mixed (HCC) 7. Attention deficit hyperactivity disorder (ADHD), unspecified ADHD type Plugged in with behavioral health through [facility]  8. Screening for colon cancer Discussed colon cancer screening methods.  She is at average risk.  Prefers Cologuard. - Cologuard  9. Influenza vaccination declined   10. Need for hepatitis C screening test - Hepatitis C Antibody  11. Screening for HIV (human immunodeficiency virus) - HIV antibody (with reflex)   Patient was given the opportunity to ask questions.  Patient verbalized understanding of the plan and was able to repeat key elements of the plan.   This documentation was completed using Paediatric nurse.  Any transcriptional errors are unintentional.  Orders Placed This Encounter  Procedures   Cologuard   CBC   Comprehensive metabolic panel   Lipid panel   HIV antibody (with reflex)   Hepatitis C Antibody   Amb ref to Medical Nutrition Therapy-MNT     Requested Prescriptions   Signed Prescriptions Disp Refills   amLODipine (NORVASC) 10 MG tablet 90 tablet 1    Sig: Take 1 tablet (10 mg total) by mouth daily.  Return in about 4 months (around 06/23/2024) for Sign release to get records from [facility] Dr Deatra James.  Jonah Blue, MD, FACP

## 2024-02-23 LAB — COMPREHENSIVE METABOLIC PANEL
ALT: 19 IU/L (ref 0–32)
AST: 19 IU/L (ref 0–40)
Albumin: 4.5 g/dL (ref 3.9–4.9)
Alkaline Phosphatase: 88 IU/L (ref 44–121)
BUN/Creatinine Ratio: 11 (ref 9–23)
BUN: 12 mg/dL (ref 6–24)
Bilirubin Total: <0.2 mg/dL (ref 0.0–1.2)
CO2: 21 mmol/L (ref 20–29)
Calcium: 9.9 mg/dL (ref 8.7–10.2)
Chloride: 107 mmol/L — ABNORMAL HIGH (ref 96–106)
Creatinine, Ser: 1.05 mg/dL — ABNORMAL HIGH (ref 0.57–1.00)
Globulin, Total: 2.7 g/dL (ref 1.5–4.5)
Glucose: 90 mg/dL (ref 70–99)
Potassium: 4.2 mmol/L (ref 3.5–5.2)
Sodium: 143 mmol/L (ref 134–144)
Total Protein: 7.2 g/dL (ref 6.0–8.5)
eGFR: 66 mL/min/{1.73_m2} (ref >59)

## 2024-02-23 LAB — LIPID PANEL
Chol/HDL Ratio: 4.6 ratio — ABNORMAL HIGH (ref 0.0–4.4)
Cholesterol, Total: 161 mg/dL (ref 100–199)
HDL: 35 mg/dL — ABNORMAL LOW (ref >39)
LDL Chol Calc (NIH): 109 mg/dL — ABNORMAL HIGH (ref 0–99)
Triglycerides: 91 mg/dL (ref 0–149)
VLDL Cholesterol Cal: 17 mg/dL (ref 5–40)

## 2024-02-23 LAB — CBC
Hematocrit: 36.6 % (ref 34.0–46.6)
Hemoglobin: 12 g/dL (ref 11.1–15.9)
MCH: 30.2 pg (ref 26.6–33.0)
MCHC: 32.8 g/dL (ref 31.5–35.7)
MCV: 92 fL (ref 79–97)
Platelets: 217 10*3/uL (ref 150–450)
RBC: 3.98 x10E6/uL (ref 3.77–5.28)
RDW: 12.8 % (ref 11.7–15.4)
WBC: 7.2 10*3/uL (ref 3.4–10.8)

## 2024-02-23 LAB — HEPATITIS C ANTIBODY: Hep C Virus Ab: NONREACTIVE

## 2024-02-23 LAB — HIV ANTIBODY (ROUTINE TESTING W REFLEX): HIV Screen 4th Generation wRfx: NONREACTIVE

## 2024-03-03 DIAGNOSIS — Z09 Encounter for follow-up examination after completed treatment for conditions other than malignant neoplasm: Secondary | ICD-10-CM | POA: Diagnosis not present

## 2024-03-05 DIAGNOSIS — Z1211 Encounter for screening for malignant neoplasm of colon: Secondary | ICD-10-CM | POA: Diagnosis not present

## 2024-03-08 LAB — COLOGUARD: COLOGUARD: NEGATIVE

## 2024-04-04 ENCOUNTER — Ambulatory Visit: Payer: BLUE CROSS/BLUE SHIELD | Admitting: Family Medicine

## 2024-04-04 ENCOUNTER — Encounter: Payer: Self-pay | Admitting: Family Medicine

## 2024-04-04 VITALS — BP 134/78 | HR 89 | Temp 98.7°F | Ht 66.0 in | Wt 239.0 lb

## 2024-04-04 DIAGNOSIS — E66812 Obesity, class 2: Secondary | ICD-10-CM

## 2024-04-04 DIAGNOSIS — Z8659 Personal history of other mental and behavioral disorders: Secondary | ICD-10-CM

## 2024-04-04 DIAGNOSIS — L0291 Cutaneous abscess, unspecified: Secondary | ICD-10-CM | POA: Diagnosis not present

## 2024-04-04 DIAGNOSIS — M25473 Effusion, unspecified ankle: Secondary | ICD-10-CM

## 2024-04-04 DIAGNOSIS — Z7689 Persons encountering health services in other specified circumstances: Secondary | ICD-10-CM

## 2024-04-04 DIAGNOSIS — I10 Essential (primary) hypertension: Secondary | ICD-10-CM

## 2024-04-04 DIAGNOSIS — M545 Low back pain, unspecified: Secondary | ICD-10-CM

## 2024-04-04 DIAGNOSIS — Z6838 Body mass index (BMI) 38.0-38.9, adult: Secondary | ICD-10-CM

## 2024-04-04 DIAGNOSIS — G8929 Other chronic pain: Secondary | ICD-10-CM
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
## 2024-04-09 ENCOUNTER — Encounter (INDEPENDENT_AMBULATORY_CARE_PROVIDER_SITE_OTHER): Payer: Self-pay | Admitting: Adult Health

## 2024-04-29 DIAGNOSIS — F315 Bipolar disorder, current episode depressed, severe, with psychotic features: Secondary | ICD-10-CM | POA: Diagnosis not present

## 2024-04-29 DIAGNOSIS — F4310 Post-traumatic stress disorder, unspecified: Secondary | ICD-10-CM | POA: Diagnosis not present

## 2024-05-30 ENCOUNTER — Ambulatory Visit (INDEPENDENT_AMBULATORY_CARE_PROVIDER_SITE_OTHER): Admitting: Family Medicine

## 2024-05-30 ENCOUNTER — Encounter: Payer: Self-pay | Admitting: Family Medicine

## 2024-05-30 VITALS — BP 110/78 | HR 68 | Temp 98.3°F | Ht 66.0 in | Wt 239.0 lb

## 2024-05-30 DIAGNOSIS — Z6838 Body mass index (BMI) 38.0-38.9, adult: Secondary | ICD-10-CM | POA: Diagnosis not present

## 2024-05-30 DIAGNOSIS — E66812 Obesity, class 2: Secondary | ICD-10-CM | POA: Diagnosis not present

## 2024-05-30 DIAGNOSIS — L309 Dermatitis, unspecified: Secondary | ICD-10-CM

## 2024-05-30 MED ORDER — TRIAMCINOLONE ACETONIDE 0.1 % EX CREA: 1.0000 | TOPICAL_CREAM | Freq: Two times a day (BID) | CUTANEOUS | 1 refills | Status: DC

## 2024-05-30 NOTE — Progress Notes (Signed)
 Established Patient Office Visit   Subjective  Patient ID: Julie Harrington, female    DOB: [birth year]  Age: 48 y.o. MRN: [id number]  Chief Complaint  Patient presents with   Rash    Top and bottom of back; x2wks, redness and itching; taking benedryl; nothing done differently    Pt is a 48 yo female seen for acute concern.  Pt endorses pruritic rash on back x 2 wks.  Has a spot starting on R arm.  Has a h/o eczema .  Tried old hydrocortisone  cream.  Using Lubriderm lotion and dove soap.  Taking hot showers.  No changes in soaps, lotions, detergents.  Sweating a little more on back.    Pt inquires about wt loss medication.  Not exercising.  Says active at work on 12 hr shift.    Patient Active Problem List   Diagnosis Date Noted   Tobacco dependence 02/22/2024   OSA on CPAP 02/22/2024   Class 2 severe obesity due to excess calories with serious comorbidity and body mass index (BMI) of 38.0 to 38.9 in adult [facility]) 02/22/2024   Attention deficit hyperactivity disorder (ADHD) 02/22/2024   Past Medical History:  Diagnosis Date   ADHD    Asthma    Bipolar disorder (HCC)    Depression    Eczema    Hypertension    Sleep apnea    Past Surgical History:  Procedure Laterality Date   BREAST REDUCTION SURGERY Bilateral 04/12/2021   Procedure: MAMMARY REDUCTION  (BREAST);  Surgeon: Barb Bonito, MD;  Location: [facility];  Service: Plastics;  Laterality: Bilateral;   CHOLECYSTECTOMY     REDUCTION MAMMAPLASTY     Social History   Tobacco Use   Smoking status: Former    Current packs/day: 1.00    Average packs/day: 1 pack/day for 20.0 years (20.0 ttl pk-yrs)    Types: Cigarettes   Smokeless tobacco: Never   Tobacco comments:    quit 12/2020  Vaping Use   Vaping status: Never Used  Substance Use Topics   Alcohol use: Yes   Drug use: No   Family History  Problem Relation Age of Onset   Diabetes Mother    High blood pressure Mother    Stroke Mother     Diabetes Father    Drug abuse Maternal Grandmother    Hypertension Other    Diabetes Other    Allergies  Allergen Reactions   Latex Rash    ROS Negative unless stated above    Objective:     BP 110/78   Pulse 68   Temp 98.3 F (36.8 C)   Ht 5' 6 (1.676 m)   Wt 239 lb (108.4 kg)   SpO2 96%   BMI 38.58 kg/m  BP Readings from Last 3 Encounters:  05/30/24 110/78  04/04/24 134/78  02/22/24 126/76   Wt Readings from Last 3 Encounters:  05/30/24 239 lb (108.4 kg)  04/04/24 239 lb (108.4 kg)  02/22/24 239 lb (108.4 kg)      Physical Exam Constitutional:      General: She is not in acute distress.    Appearance: Normal appearance.  HENT:     Head: Normocephalic and atraumatic.     Nose: Nose normal.     Mouth/Throat:     Mouth: Mucous membranes are moist.   Cardiovascular:     Rate and Rhythm: Normal rate and regular rhythm.     Heart sounds: Normal heart sounds. No murmur  heard.    No gallop.  Pulmonary:     Effort: Pulmonary effort is normal. No respiratory distress.     Breath sounds: Normal breath sounds. No wheezing, rhonchi or rales.   Skin:    General: Skin is warm and dry.         Comments: Dry, hyperpigmented thickened skin with excoriations of lower back with a small abrasion.  Small patch of dry skin on R medial forearm.   Neurological:     Mental Status: She is alert and oriented to person, place, and time.     Cranial Nerves: Cranial nerves 2-12 are intact.   Psychiatric:        Behavior: Behavior is cooperative.        04/04/2024    2:55 PM 02/22/2024    9:09 AM  Depression screen PHQ 2/9  Decreased Interest 0 0  Down, Depressed, Hopeless 0 0  PHQ - 2 Score 0 0  Altered sleeping 1 0  Tired, decreased energy 1 0  Change in appetite 0 0  Feeling bad or failure about yourself  0 0  Trouble concentrating 1 0  Moving slowly or fidgety/restless 0 0  Suicidal thoughts 0 0  PHQ-9 Score 3 0  Difficult doing work/chores Somewhat  difficult Not difficult at all      04/04/2024    2:55 PM 02/22/2024    9:09 AM  GAD 7 : Generalized Anxiety Score  Nervous, Anxious, on Edge 0 0  Control/stop worrying 0 0  Worry too much - different things 0 0  Trouble relaxing 0 0  Restless 0 0  Easily annoyed or irritable 0 0  Afraid - awful might happen 0 0  Total GAD 7 Score 0 0  Anxiety Difficulty Not difficult at all Not difficult at all     No results found for any visits on 05/30/24.    Assessment & Plan:   Eczema, unspecified type -     Triamcinolone  Acetonide; Apply 1 Application topically 2 (two) times daily.  Dispense: 80 g; Refill: 1  Class 2 severe obesity due to excess calories with serious comorbidity and body mass index (BMI) of 38.0 to 38.9 in adult [facility])  Eczema flair.  Triamcinolone  prn.  Gentle soaps, lotions, detergents.  Avoid hot showers.  Powder or moisture wicking fabrics to help decrease sweat.  Body mass index is 38.58 kg/m.  Advised to increase physical activity and made diet changes.   Offered referral to [facility].  Pt to notify clinic if wishes to proceed.  Return if symptoms worsen or fail to improve.   Viola Greulich, MD

## 2024-06-23 ENCOUNTER — Ambulatory Visit: Admitting: Internal Medicine

## 2024-07-16 ENCOUNTER — Ambulatory Visit: Payer: Self-pay

## 2024-07-16 NOTE — Telephone Encounter (Signed)
 FYI Only or Action Required?: FYI only for provider.  Patient was last seen in primary care on 05/30/2024 by Mercer Clotilda SAUNDERS, MD.  Called Nurse Triage reporting Hypertension and Headache.  Symptoms began a week ago.  Interventions attempted: OTC medications: Advil, Aleve .  Symptoms are: mild ache on sides of head and back of neck, blurry vision with fine print only, hypertension (143/83 this morning)improves with OTC medications.  Triage Disposition: See PCP When Office is Open (Within 3 Days)  Patient/caregiver understands and will follow disposition?: Yes                            Message from [place] E sent at 07/16/2024  8:15 AM EDT  Summary: Elevated BP / Headaches   Reason for Triage: Patient took her blood pressure last night that read 140/80, was taken around 8 pm. Patient also stated she is having some headaches.         Reason for Disposition  [1] New-onset headache AND [2] age > 50 years  Answer Assessment - Initial Assessment Questions 1. LOCATION: Where does it hurt?      Sides of head and back of neck. She states if feels like it wants to go in her eyes.  2. ONSET: When did the headache start? (e.g., minutes, hours, days)      X 1 week.  3. PATTERN: Does the pain come and go, or has it been constant since it started?     Comes and goes.  4. SEVERITY: How bad is the pain? and What does it keep you from doing?  (e.g., Scale 1-10; mild, moderate, or severe)     She states it is mild and ache.  5. RECURRENT SYMPTOM: Have you ever had headaches before? If Yes, ask: When was the last time? and What happened that time?      She states she has had a headache before but it has been awhile.  6. CAUSE: What do you think is causing the headache?     Patient states she smokes and she is unsure if that is related as well.  7. MIGRAINE: Have you been diagnosed with migraine headaches? If Yes, ask: Is this headache similar?       No.  8. HEAD INJURY: Has there been any recent injury to your head?      No.  9. OTHER SYMPTOMS: Do you have any other symptoms? (e.g., fever, stiff neck, eye pain, sore throat, cold symptoms)     Hypertension (143/83 this morning around 0915, 140/80 last night at 8pm), blurry vision with fine print (x today) Patient denies neck stiffness, fever, unilateral numbness or weakness, facial droop, changes in speech, sinus congestion, earaches, cough.   10. PREGNANCY: Is there any chance you are pregnant? When was your last menstrual period?       LMP: 1 year ago.  Protocols used: [facility]

## 2024-07-17 ENCOUNTER — Encounter: Payer: Self-pay | Admitting: Family Medicine

## 2024-07-17 ENCOUNTER — Ambulatory Visit (INDEPENDENT_AMBULATORY_CARE_PROVIDER_SITE_OTHER): Admitting: Family Medicine

## 2024-07-17 VITALS — BP 132/80 | HR 94 | Temp 98.4°F | Ht 66.0 in | Wt 238.8 lb

## 2024-07-17 DIAGNOSIS — F319 Bipolar disorder, unspecified: Secondary | ICD-10-CM

## 2024-07-17 DIAGNOSIS — F17200 Nicotine dependence, unspecified, uncomplicated: Secondary | ICD-10-CM | POA: Diagnosis not present

## 2024-07-17 DIAGNOSIS — I10 Essential (primary) hypertension: Secondary | ICD-10-CM | POA: Diagnosis not present

## 2024-07-17 DIAGNOSIS — R519 Headache, unspecified: Secondary | ICD-10-CM | POA: Diagnosis not present

## 2024-07-17 DIAGNOSIS — H538 Other visual disturbances: Secondary | ICD-10-CM | POA: Diagnosis not present

## 2024-07-17 DIAGNOSIS — G4733 Obstructive sleep apnea (adult) (pediatric): Secondary | ICD-10-CM

## 2024-07-17 MED ORDER — NICOTINE 21 MG/24HR TD PT24: 21.0000 mg | MEDICATED_PATCH | Freq: Every day | TRANSDERMAL | 1 refills | Status: AC

## 2024-07-17 NOTE — Patient Instructions (Signed)
 Prescription for NicoDerm patches were sent to your pharmacy.  Apply 1 patch daily.  Work on decreasing the amount of cigarettes you are smoking.  When a refill is needed notify clinic so we can decrease the dose of the NicoDerm patch.  Do not forget to start wearing your glasses and wear your CPAP nightly.

## 2024-07-17 NOTE — Progress Notes (Addendum)
 Established Patient Office Visit   Subjective  Patient ID: Julie Harrington, female    DOB: 1976/10/24  Age: 48 y.o. MRN: [id number]  Chief Complaint  Patient presents with   Acute Visit    Headaches, Neck and head, with Blurry vision, started a week ago, rate of pain 4/10    Pt is a 48 year old female seen for acute concern.  Patient endorses headache x 1 week.  Pain in left frontal area/face into left neck.  Has a dull headache today and right frontal area.  Patient denies sinus pain/pressure, rhinorrhea.  Endorses blurred vision when reading fine print.  Had eye exam this year.  Given glasses but does not wear them.  Pt also with OSA on CPAP but has not used in a while.  Endorses difficulty staying asleep.  Works nights 1 week on 1 week off which causes changes in attempted sleep pattern.  Also has difficulty getting comfortable due to back pain.  Placing a pillow underneath lumbar spine when laying on back.  Unable to lay on sides due to hip pain.  Pt smoking 1.5 packs of cigarettes per day times last 3 years.  Smoking and total over 20 years.  Quit before cold [place].  Interested in nicotine  patch.    Taking Norvasc  10 mg daily for HTN.  States BP at home 143/83.    Taking risperidone but is not sure it is working.  Has not mentioned this to [facility] provider at [facility].    Patient Active Problem List   Diagnosis Date Noted   Tobacco dependence 02/22/2024   OSA on CPAP 02/22/2024   Class 2 severe obesity due to excess calories with serious comorbidity and body mass index (BMI) of 38.0 to 38.9 in adult [facility]) 02/22/2024   Attention deficit hyperactivity disorder (ADHD) 02/22/2024   Past Medical History:  Diagnosis Date   ADHD    Asthma    Bipolar disorder (HCC)    Depression    Eczema    Hypertension    Sleep apnea    Past Surgical History:  Procedure Laterality Date   BREAST REDUCTION SURGERY Bilateral 04/12/2021   Procedure: MAMMARY REDUCTION  (BREAST);  Surgeon: Elisabeth Craig RAMAN, MD;  Location: [facility];  Service: Plastics;  Laterality: Bilateral;   CHOLECYSTECTOMY     REDUCTION MAMMAPLASTY     Social History   Tobacco Use   Smoking status: Former    Current packs/day: 1.00    Average packs/day: 1 pack/day for 20.0 years (20.0 ttl pk-yrs)    Types: Cigarettes   Smokeless tobacco: Never   Tobacco comments:    quit 12/2020  Vaping Use   Vaping status: Never Used  Substance Use Topics   Alcohol use: Yes   Drug use: No   Family History  Problem Relation Age of Onset   Diabetes Mother    High blood pressure Mother    Stroke Mother    Diabetes Father    Drug abuse Maternal Grandmother    Hypertension Other    Diabetes Other    Allergies  Allergen Reactions   Wellbutrin [Bupropion] Other (See Comments)    paranoia   Latex Rash    ROS Negative unless stated above    Objective:     BP 132/80 (BP Location: Left Arm, Patient Position: Sitting, Cuff Size: Large)   Pulse 94   Temp 98.4 F (36.9 C) (Oral)   Ht 5' 6 (1.676 m)   Wt 238 lb  12.8 oz (108.3 kg)   LMP  (LMP Unknown)   SpO2 97%   BMI 38.54 kg/m  BP Readings from Last 3 Encounters:  07/17/24 132/80  05/30/24 110/78  04/04/24 134/78   Wt Readings from Last 3 Encounters:  07/17/24 238 lb 12.8 oz (108.3 kg)  05/30/24 239 lb (108.4 kg)  04/04/24 239 lb (108.4 kg)      Physical Exam Constitutional:      General: She is not in acute distress.    Appearance: Normal appearance.  HENT:     Head: Normocephalic and atraumatic.     Nose: Nose normal.     Right Sinus: No maxillary sinus tenderness or frontal sinus tenderness.     Left Sinus: No maxillary sinus tenderness or frontal sinus tenderness.     Mouth/Throat:     Mouth: Mucous membranes are moist.  Eyes:     General: Lids are normal.     Extraocular Movements: Extraocular movements intact.     Right eye: No nystagmus.     Left eye: No nystagmus.     Conjunctiva/sclera: Conjunctivae normal.   Cardiovascular:     Rate and Rhythm: Normal rate and regular rhythm.     Heart sounds: Normal heart sounds. No murmur heard.    No gallop.  Pulmonary:     Effort: Pulmonary effort is normal. No respiratory distress.     Breath sounds: Normal breath sounds. No wheezing, rhonchi or rales.  Musculoskeletal:       Arms:     Comments: TTP of midline cervical spine.  No TTP of bilateral shoulders or thoracic spine.  Skin:    General: Skin is warm and dry.  Neurological:     Mental Status: She is alert and oriented to person, place, and time.        07/17/2024   12:29 PM 04/04/2024    2:55 PM 02/22/2024    9:09 AM  Depression screen PHQ 2/9  Decreased Interest 0 0 0  Down, Depressed, Hopeless 1 0 0  PHQ - 2 Score 1 0 0  Altered sleeping 1 1 0  Tired, decreased energy 1 1 0  Change in appetite 0 0 0  Feeling bad or failure about yourself  0 0 0  Trouble concentrating 0 1 0  Moving slowly or fidgety/restless 0 0 0  Suicidal thoughts 0 0 0  PHQ-9 Score 3 3 0  Difficult doing work/chores Not difficult at all Somewhat difficult Not difficult at all      07/17/2024   12:29 PM 04/04/2024    2:55 PM 02/22/2024    9:09 AM  GAD 7 : Generalized Anxiety Score  Nervous, Anxious, on Edge 0 0 0  Control/stop worrying 0 0 0  Worry too much - different things 0 0 0  Trouble relaxing 0 0 0  Restless 1 0 0  Easily annoyed or irritable 0 0 0  Afraid - awful might happen 0 0 0  Total GAD 7 Score 1 0 0  Anxiety Difficulty Not difficult at all Not difficult at all Not difficult at all     No results found for any visits on 07/17/24.    Assessment & Plan:   Acute nonintractable headache, unspecified headache type  Tobacco dependence -     Nicotine ; Place 1 patch (21 mg total) onto the skin daily.  Dispense: 28 patch; Refill: 1  Essential hypertension  Blurred vision  OSA on CPAP  Bipolar affective disorder, remission status unspecified (  HCC)  Patient with acute headache x 1 week.   Discussed symptoms likely multifactorial including elevated blood pressure, OSA, eyestrain, nicotine  use.  Patient advised to monitor BP at home and keep a log of readings to bring with her to clinic.  Continue Norvasc  10 mg daily.  Discussed wearing CPAP nightly.  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  Advised to keep the same sleep schedule when off.  Patient advised to wear glasses daily for reading.  Smoking cessation counseling greater than 3 minutes, less than 10 minutes.  Patient currently smoking 1.5 packs cigarettes per day.  Interested in quitting.  Given Rx for NicoDerm patches.  Discussed changing routine.  Patient reports PHQ-9 and GAD-7 scores 3 and 1.  Encouraged to follow-up with [facility] provider at [facility] regarding risperidone 3 mg twice daily effectiveness.  Return in about 8 weeks (around 09/11/2024).   Clotilda JONELLE Single, MD

## 2024-09-01 ENCOUNTER — Ambulatory Visit (INDEPENDENT_AMBULATORY_CARE_PROVIDER_SITE_OTHER): Admitting: Family Medicine

## 2024-09-01 ENCOUNTER — Encounter: Payer: Self-pay | Admitting: Family Medicine

## 2024-09-01 VITALS — BP 122/68 | HR 86 | Temp 98.3°F | Resp 16 | Ht 66.0 in | Wt 233.0 lb

## 2024-09-01 DIAGNOSIS — G4762 Sleep related leg cramps: Secondary | ICD-10-CM | POA: Diagnosis not present

## 2024-09-01 DIAGNOSIS — R2242 Localized swelling, mass and lump, left lower limb: Secondary | ICD-10-CM

## 2024-09-01 DIAGNOSIS — R5382 Chronic fatigue, unspecified: Secondary | ICD-10-CM | POA: Diagnosis not present

## 2024-09-01 MED ORDER — BACLOFEN 10 MG PO TABS: 5.0000 mg | ORAL_TABLET | Freq: Every evening | ORAL | 0 refills | Status: AC | PRN

## 2024-09-01 MED ORDER — BACLOFEN 10 MG PO TABS: 5.0000 mg | ORAL_TABLET | Freq: Every evening | ORAL | 0 refills | Status: DC | PRN

## 2024-09-01 NOTE — Patient Instructions (Addendum)
 A few things to remember from today's visit:  Chronic fatigue  Nocturnal leg cramps  Mass of thigh, left  Try topical icy hot at bedtime for leg cramps and change the way you stretch. If not better you can try a muscle relaxant, which causes drowsiness. Sleep apnea can be causing fatigue and cramps.  Monitor thigh for changes.  Do not use My Chart to request refills or for acute issues that need immediate attention. If you send a my chart message, it may take a few days to be addressed, specially if I am not in the office.  Please be sure medication list is accurate. If a new problem present, please set up appointment sooner than planned today.

## 2024-09-01 NOTE — Progress Notes (Signed)
 ACUTE VISIT Chief Complaint  Patient presents with   Mass   Discussed the use of AI scribe software for clinical note transcription with the patient, who gave verbal consent to proceed.  History of Present Illness Julie Harrington is a 48 year old female with PMHx significant for OSA, ADHD, HTN, and bipolar depression here today c/o a bump in her left inner distal thigh.  A little over a week ago, she noticed a bump in her left inner distal thigh, accompanied by some pain in the area. A knot developed above the knee; she reports that it has decreased in size and the pain has improved.  She denies any recent injury/trauma. States that she recently wore compression socks for the first time, though she does not believe they were tight enough to cause the issue. No skin changes such as redness or discoloration have been observed, and she has not taken any medication or treatment for it. She has never experienced a similar issue before.  She experiences frequent leg cramps, particularly in her calves and feet, which often wake her from sleep. These cramps occur when she stretches and are described as 'Charlie horses' in the back of her leg.  She also c/o feeling cold and tired for several months.   She is not currently taking any diuretics and is on  Amlodipine  for HTN and Risperdal for bipolar disorder.  OSA: She has not been using her CPAP machine in a while.  No fever, chills, abnormal weight loss, CP,difficulty breathing, or palpitations. She reports always having body aches but nothing unusual.   Lab Results  Component Value Date   NA 143 02/22/2024   CL 107 (H) 02/22/2024   K 4.2 02/22/2024   CO2 21 02/22/2024   BUN 12 02/22/2024   CREATININE 1.05 (H) 02/22/2024   EGFR 66 02/22/2024   CALCIUM 9.9 02/22/2024   ALBUMIN 4.5 02/22/2024   GLUCOSE 90 02/22/2024   Lab Results  Component Value Date   WBC 7.2 02/22/2024   HGB 12.0 02/22/2024   HCT 36.6 02/22/2024   MCV 92  02/22/2024   PLT 217 02/22/2024   Review of Systems  Constitutional:  Positive for fatigue. Negative for activity change, appetite change and fever.  HENT:  Negative for mouth sores and sore throat.   Respiratory:  Negative for cough and wheezing.   Cardiovascular:  Negative for leg swelling.  Gastrointestinal:  Negative for abdominal pain, nausea and vomiting.  Endocrine: Negative for heat intolerance.  Genitourinary:  Negative for decreased urine volume, dysuria and hematuria.  Skin:  Negative for rash.  Neurological:  Negative for weakness and numbness.  See other pertinent positives and negatives in HPI.  Current Outpatient Medications on File Prior to Visit  Medication Sig Dispense Refill   amLODipine  (NORVASC ) 10 MG tablet Take 1 tablet (10 mg total) by mouth daily. 90 tablet 1   IRON, FERROUS SULFATE, PO Take by mouth.     nicotine  (NICODERM CQ ) 21 mg/24hr patch Place 1 patch (21 mg total) onto the skin daily. 28 patch 1   risperiDONE (RISPERDAL) 0.25 MG tablet Take 6 mg by mouth at bedtime.     triamcinolone  cream (KENALOG ) 0.1 % Apply 1 Application topically 2 (two) times daily. 80 g 1   No current facility-administered medications on file prior to visit.   Past Medical History:  Diagnosis Date   ADHD    Asthma    Bipolar disorder (HCC)    Depression  Eczema    Hypertension    Sleep apnea    Allergies  Allergen Reactions   Wellbutrin [Bupropion] Other (See Comments)    paranoia   Latex Rash    Social History   Socioeconomic History   Marital status: Single    Spouse name: Not on file   Number of children: 2   Years of education: Not on file   Highest education level: Associate degree: occupational, Scientist, product/process development, or vocational program  Occupational History   Occupation: CMA at [employer]  Tobacco Use   Smoking status: Former    Current packs/day: 1.00    Average packs/day: 1 pack/day for 20.0 years (20.0 ttl pk-yrs)    Types: Cigarettes   Smokeless  tobacco: Never   Tobacco comments:    quit 12/2020  Vaping Use   Vaping status: Never Used  Substance and Sexual Activity   Alcohol use: Yes   Drug use: No   Sexual activity: Not on file  Other Topics Concern   Not on file  Social History Narrative   Not on file   Social Drivers of Health   Financial Resource Strain: Low Risk  (02/22/2024)   Overall Financial Resource Strain (CARDIA)    Difficulty of Paying Living Expenses: Not hard at all  Food Insecurity: No Food Insecurity (02/22/2024)   Hunger Vital Sign    Worried About Running Out of Food in the Last Year: Never true    Ran Out of Food in the Last Year: Never true  Transportation Needs: No Transportation Needs (02/22/2024)   PRAPARE - Administrator, Civil Service (Medical): No    Lack of Transportation (Non-Medical): No  Physical Activity: Inactive (02/22/2024)   Exercise Vital Sign    Days of Exercise per Week: 0 days    Minutes of Exercise per Session: 0 min  Stress: No Stress Concern Present (02/22/2024)   [employer] of Occupational Health - Occupational Stress Questionnaire    Feeling of Stress : Not at all  Social Connections: Moderately Isolated (02/22/2024)   Social Connection and Isolation Panel    Frequency of Communication with Friends and Family: Twice a week    Frequency of Social Gatherings with Friends and Family: Twice a week    Attends Religious Services: More than 4 times per year    Active Member of Clubs or Organizations: No    Attends Banker Meetings: Never    Marital Status: Never married    Vitals:   09/01/24 0733  BP: 122/68  Pulse: 86  Resp: 16  Temp: 98.3 F (36.8 C)  SpO2: 97%   Body mass index is 37.61 kg/m.  Physical Exam Vitals and nursing note reviewed.  Constitutional:      General: She is not in acute distress.    Appearance: She is well-developed.  HENT:     Head: Normocephalic and atraumatic.  Eyes:     Conjunctiva/sclera: Conjunctivae  normal.  Cardiovascular:     Rate and Rhythm: Normal rate and regular rhythm.     Heart sounds: No murmur heard.    Comments: DP pulses palpable. Pulmonary:     Effort: Pulmonary effort is normal. No respiratory distress.     Breath sounds: Normal breath sounds.  Musculoskeletal:     Right lower leg: No edema.     Left lower leg: No deformity or tenderness. No edema.       Legs:  Lymphadenopathy:     Cervical: No cervical adenopathy.  Skin:    General: Skin is warm.     Findings: No erythema or rash.  Neurological:     Mental Status: She is alert and oriented to person, place, and time.  Psychiatric:        Mood and Affect: Mood and affect normal.    ASSESSMENT AND PLAN:  Ms. Shiffer was seen today for thigh lump and leg cramps.  Nocturnal leg cramps We discussed possible etiologies. Problem is chronic. She has blood work in 02/2024 and otherwise normal electrolytes. Recommend topical icy hot or asper cream on calves and avoid trigger factors. Continue adequate hydration. If problem is persistent she can try Baclofen  10 mg 1/2-1 tab at bedtime as needed, some side effects discussed. F/U with PCP as needed.  -     Baclofen ; Take 0.5-1 tablets (5-10 mg total) by mouth at bedtime as needed for muscle spasms (cramps).  Dispense: 30 each; Refill: 0  Chronic fatigue Some of her chronic comorbilities can be contributing factors, specially OSA. Encouraged compliance with wearing CPAP. I do not think blood work is needed at this time.  Mass of thigh, left She has difficulty finding area where she noted mass, I did not appreciate any mass or skin abnormality. ?  Deep Sebaceous cyst, lipoma. History and examination today do not suggest a serious process. Recommend to monitor for changes. F/U as needed.  I personally spent a total of 34 minutes in the care of the patient today including preparing to see the patient, getting/reviewing separately obtained history, performing a medically  appropriate exam/evaluation, counseling and educating, documenting clinical information in the EHR, and independently interpreting results.  Return in about 6 weeks (around 10/13/2024) for cramps and left thigh mass with PCP.  Ples Trudel G. [place], MD  [facility]. [place] office.

## 2024-09-11 ENCOUNTER — Ambulatory Visit: Admitting: Family Medicine

## 2024-10-04 ENCOUNTER — Other Ambulatory Visit: Payer: Self-pay | Admitting: Internal Medicine

## 2024-10-06 DIAGNOSIS — F315 Bipolar disorder, current episode depressed, severe, with psychotic features: Secondary | ICD-10-CM | POA: Diagnosis not present

## 2024-10-07 ENCOUNTER — Other Ambulatory Visit: Payer: Self-pay | Admitting: Family Medicine

## 2024-10-07 NOTE — Telephone Encounter (Unsigned)
 Copied from CRM [id number]. Topic: Clinical - Medication Refill >> Oct 07, 2024 12:57 PM Thersia C wrote: Medication: amLODipine  (NORVASC ) 10 MG tablet  Has the patient contacted their pharmacy? Yes (Agent: If no, request that the patient contact the pharmacy for the refill. If patient does not wish to contact the pharmacy document the reason why and proceed with request.) (Agent: If yes, when and what did the pharmacy advise?)  This is the patient's preferred pharmacy:  [facility] ST[facility] #[id number],  - [address] AT [facility] [address] [place] [address] Phone: [phone] Fax: [phone]  Is this the correct pharmacy for this prescription? Yes If no, delete pharmacy and type the correct one.   Has the prescription been filled recently? No  Is the patient out of the medication? Yes  Has the patient been seen for an appointment in the last year OR does the patient have an upcoming appointment? Yes  Can we respond through MyChart? Yes  Agent: Please be advised that Rx refills may take up to 3 business days. We ask that you follow-up with your pharmacy.

## 2024-10-10 ENCOUNTER — Other Ambulatory Visit: Payer: Self-pay

## 2024-10-10 MED ORDER — AMLODIPINE BESYLATE 10 MG PO TABS: 10.0000 mg | ORAL_TABLET | Freq: Every day | ORAL | 0 refills | Status: DC

## 2024-10-13 ENCOUNTER — Ambulatory Visit: Admitting: Family Medicine

## 2024-10-17 ENCOUNTER — Ambulatory Visit: Admitting: Family Medicine

## 2024-10-17 ENCOUNTER — Encounter: Payer: Self-pay | Admitting: Family Medicine

## 2024-10-17 VITALS — BP 118/60 | HR 78 | Temp 98.4°F | Wt 232.6 lb

## 2024-10-17 DIAGNOSIS — J302 Other seasonal allergic rhinitis: Secondary | ICD-10-CM

## 2024-10-17 DIAGNOSIS — G4733 Obstructive sleep apnea (adult) (pediatric): Secondary | ICD-10-CM | POA: Diagnosis not present

## 2024-10-17 NOTE — Progress Notes (Addendum)
 Established Patient Office Visit   Subjective  Patient ID: Julie Harrington, female    DOB: 09/14/1976  Age: 48 y.o. MRN: [id number]  Chief Complaint  Patient presents with   Acute Visit    6 week follow-up for Cramps and left thigh mass, patient denies any cramping and mass, patient is having some congestion started over a week ago     Pt is a 48 yo female seen for acute concern.  Pt with nasal congestion x 1 wk.  CPAP mask presses against her nose, causing discomfort and possibly the congestion. She has not tried any treatments for the congestion due to her work schedule. No HA, sore throat, cough, ear pain, facial pain, headaches, watery or itchy eyes, nausea, vomiting, or recent exposure to sick individuals. Also with rhinorrhea.  Has has CPAP x 5 yrs.  She has a dog that sheds a lot.    No longer having leg cramps.    Patient Active Problem List   Diagnosis Date Noted   Tobacco dependence 02/22/2024   OSA on CPAP 02/22/2024   Class 2 severe obesity due to excess calories with serious comorbidity and body mass index (BMI) of 38.0 to 38.9 in adult 02/22/2024   Attention deficit hyperactivity disorder (ADHD) 02/22/2024   Past Medical History:  Diagnosis Date   ADHD    Asthma    Bipolar disorder (HCC)    Depression    Eczema    Hypertension    Sleep apnea    Past Surgical History:  Procedure Laterality Date   BREAST REDUCTION SURGERY Bilateral 04/12/2021   Procedure: MAMMARY REDUCTION  (BREAST);  Surgeon: Elisabeth Craig RAMAN, MD;  Location: [facility];  Service: Plastics;  Laterality: Bilateral;   CHOLECYSTECTOMY     REDUCTION MAMMAPLASTY     Social History   Tobacco Use   Smoking status: Former    Current packs/day: 1.00    Average packs/day: 1 pack/day for 20.0 years (20.0 ttl pk-yrs)    Types: Cigarettes   Smokeless tobacco: Never   Tobacco comments:    quit 12/2020  Vaping Use   Vaping status: Never Used  Substance Use Topics   Alcohol use:  Yes   Drug use: No   Family History  Problem Relation Age of Onset   Diabetes Mother    High blood pressure Mother    Stroke Mother    Diabetes Father    Drug abuse Maternal Grandmother    Hypertension Other    Diabetes Other    Allergies  Allergen Reactions   Wellbutrin [Bupropion] Other (See Comments)    paranoia   Latex Rash    ROS Negative unless stated above    Objective:     BP 118/60 (BP Location: Right Arm, Patient Position: Sitting)   Pulse 78   Temp 98.4 F (36.9 C)   Wt 232 lb 9.6 oz (105.5 kg)   SpO2 98%   BMI 37.54 kg/m  BP Readings from Last 3 Encounters:  09/01/24 122/68  07/17/24 132/80  05/30/24 110/78   Wt Readings from Last 3 Encounters:  09/01/24 233 lb (105.7 kg)  07/17/24 238 lb 12.8 oz (108.3 kg)  05/30/24 239 lb (108.4 kg)      Physical Exam Constitutional:      General: She is not in acute distress.    Appearance: Normal appearance.  HENT:     Head: Normocephalic and atraumatic.     Nose: Nose normal.  Mouth/Throat:     Mouth: Mucous membranes are moist.  Cardiovascular:     Rate and Rhythm: Normal rate and regular rhythm.     Heart sounds: Normal heart sounds. No murmur heard.    No gallop.  Pulmonary:     Effort: Pulmonary effort is normal. No respiratory distress.     Breath sounds: Normal breath sounds. No wheezing, rhonchi or rales.  Skin:    General: Skin is warm and dry.  Neurological:     Mental Status: She is alert and oriented to person, place, and time.        07/17/2024   12:29 PM 04/04/2024    2:55 PM 02/22/2024    9:09 AM  Depression screen PHQ 2/9  Decreased Interest 0 0 0  Down, Depressed, Hopeless 1 0 0  PHQ - 2 Score 1 0 0  Altered sleeping 1 1 0  Tired, decreased energy 1 1 0  Change in appetite 0 0 0  Feeling bad or failure about yourself  0 0 0  Trouble concentrating 0 1 0  Moving slowly or fidgety/restless 0 0 0  Suicidal thoughts 0 0 0  PHQ-9 Score 3  3  0   Difficult doing  work/chores Not difficult at all Somewhat difficult Not difficult at all     Data saved with a previous flowsheet row definition      07/17/2024   12:29 PM 04/04/2024    2:55 PM 02/22/2024    9:09 AM  GAD 7 : Generalized Anxiety Score  Nervous, Anxious, on Edge 0 0 0  Control/stop worrying 0 0 0  Worry too much - different things 0 0 0  Trouble relaxing 0 0 0  Restless 1 0 0  Easily annoyed or irritable 0 0 0  Afraid - awful might happen 0 0 0  Total GAD 7 Score 1 0 0  Anxiety Difficulty Not difficult at all Not difficult at all Not difficult at all     No results found for any visits on 10/17/24.    Assessment & Plan:   Seasonal allergic rhinitis, unspecified trigger  OSA on CPAP -     Pulmonary Visit  Nasal congestion and rhinorrhea possibly due to seasonal and environmental allergies.  Possibly worsened by CPAP.  OTC antihistamines.  Consider saline nasal rinse.  Referral to Pulm for sleep med cpap adjustments/new mask if needed.  Return if symptoms worsen or fail to improve.   Clotilda JONELLE Single, MD

## 2024-11-17 ENCOUNTER — Ambulatory Visit: Admitting: Family Medicine

## 2024-11-17 ENCOUNTER — Encounter: Payer: Self-pay | Admitting: Family Medicine

## 2024-11-17 VITALS — BP 120/80 | HR 70 | Temp 97.9°F | Ht 66.0 in | Wt 235.6 lb

## 2024-11-17 DIAGNOSIS — L819 Disorder of pigmentation, unspecified: Secondary | ICD-10-CM | POA: Diagnosis not present

## 2024-11-17 DIAGNOSIS — M2141 Flat foot [pes planus] (acquired), right foot: Secondary | ICD-10-CM | POA: Diagnosis not present

## 2024-11-17 DIAGNOSIS — M25571 Pain in right ankle and joints of right foot: Secondary | ICD-10-CM | POA: Diagnosis not present

## 2024-11-17 DIAGNOSIS — B351 Tinea unguium: Secondary | ICD-10-CM

## 2024-11-17 DIAGNOSIS — R202 Paresthesia of skin: Secondary | ICD-10-CM

## 2024-11-17 DIAGNOSIS — M2142 Flat foot [pes planus] (acquired), left foot: Secondary | ICD-10-CM

## 2024-11-17 DIAGNOSIS — G8929 Other chronic pain: Secondary | ICD-10-CM

## 2024-11-17 NOTE — Progress Notes (Signed)
 Established Patient Office Visit   Subjective  Patient ID: Julie Harrington, female    DOB: 1976/05/24  Age: 48 y.o. MRN: [id number]  Chief Complaint  Patient presents with   Acute Visit    Patient is here because she has dark ankles and tingling in her ankles.    Patient is a 48 year old female seen for ongoing concern.  Patient endorses tingling in left ankle x 1 week.  Also notes dark spots on skin of bilateral ankles x 1 month or more.  Right ankle hurting especially at work.  Wearing an ankle brace.  Patient denies injury, new shoes, edema, pruritus.  Has an area of eczema on the right medial ankle superior to area of darkened skin.  Patient concerned about her circulation.    Patient Active Problem List   Diagnosis Date Noted   Tobacco dependence 02/22/2024   OSA on CPAP 02/22/2024   Class 2 severe obesity due to excess calories with serious comorbidity and body mass index (BMI) of 38.0 to 38.9 in adult 02/22/2024   Attention deficit hyperactivity disorder (ADHD) 02/22/2024   Past Medical History:  Diagnosis Date   ADHD    Asthma    Bipolar disorder (HCC)    Depression    Eczema    Hypertension    Sleep apnea    Past Surgical History:  Procedure Laterality Date   BREAST REDUCTION SURGERY Bilateral 04/12/2021   Procedure: MAMMARY REDUCTION  (BREAST);  Surgeon: Elisabeth Craig RAMAN, MD;  Location: [facility];  Service: Plastics;  Laterality: Bilateral;   CHOLECYSTECTOMY     REDUCTION MAMMAPLASTY     Social History   Tobacco Use   Smoking status: Former    Current packs/day: 1.00    Average packs/day: 1 pack/day for 20.0 years (20.0 ttl pk-yrs)    Types: Cigarettes   Smokeless tobacco: Never   Tobacco comments:    quit 12/2020  Vaping Use   Vaping status: Never Used  Substance Use Topics   Alcohol use: Yes   Drug use: No   Family History  Problem Relation Age of Onset   Diabetes Mother    High blood pressure Mother    Stroke Mother     Diabetes Father    Drug abuse Maternal Grandmother    Hypertension Other    Diabetes Other    Allergies  Allergen Reactions   Wellbutrin [Bupropion] Other (See Comments)    paranoia   Latex Rash    ROS Negative unless stated above    Objective:     BP 120/80 (BP Location: Left Arm, Patient Position: Sitting, Cuff Size: Large)   Pulse 70   Temp 97.9 F (36.6 C) (Oral)   Ht 5' 6 (1.676 m)   Wt 235 lb 9.6 oz (106.9 kg)   SpO2 98%   BMI 38.03 kg/m  BP Readings from Last 3 Encounters:  11/17/24 120/80  10/17/24 118/60  09/01/24 122/68   Wt Readings from Last 3 Encounters:  11/17/24 235 lb 9.6 oz (106.9 kg)  10/17/24 232 lb 9.6 oz (105.5 kg)  09/01/24 233 lb (105.7 kg)      Physical Exam Constitutional:      Appearance: Normal appearance.  HENT:     Head: Normocephalic and atraumatic.  Cardiovascular:     Rate and Rhythm: Normal rate.  Pulmonary:     Effort: Pulmonary effort is normal.  Skin:    General: Skin is warm and dry.     Comments:  Hyperpigmentation of skin of b/l ankles.  No TTP.  DP and PT pulses b/l 2+.  Onychomycosis of toenails bilateral feet.  Flat feet b/l causing R ankle inversion.  Neurological:     Mental Status: She is alert and oriented to person, place, and time.        10/17/2024    5:01 PM 07/17/2024   12:29 PM 04/04/2024    2:55 PM  Depression screen PHQ 2/9  Decreased Interest 0 0 0  Down, Depressed, Hopeless 0 1 0  PHQ - 2 Score 0 1 0  Altered sleeping 0 1 1  Tired, decreased energy 0 1 1  Change in appetite 0 0 0  Feeling bad or failure about yourself  0 0 0  Trouble concentrating 0 0 1  Moving slowly or fidgety/restless 0 0 0  Suicidal thoughts 0 0 0  PHQ-9 Score 0 3  3   Difficult doing work/chores  Not difficult at all Somewhat difficult     Data saved with a previous flowsheet row definition      10/17/2024    5:03 PM 07/17/2024   12:29 PM 04/04/2024    2:55 PM 02/22/2024    9:09 AM  GAD 7 : Generalized Anxiety Score   Nervous, Anxious, on Edge 0 0 0 0  Control/stop worrying 0 0 0 0  Worry too much - different things 0 0 0 0  Trouble relaxing 0 0 0 0  Restless 0 1 0 0  Easily annoyed or irritable 0 0 0 0  Afraid - awful might happen 0 0 0 0  Total GAD 7 Score 0 1 0 0  Anxiety Difficulty Not difficult at all Not difficult at all Not difficult at all Not difficult at all     No results found for any visits on 11/17/24.    Assessment & Plan:   Flat feet, bilateral -     Ambulatory referral to Podiatry  Chronic pain of right ankle -     Ambulatory referral to Podiatry  Hyperpigmentation -     Ambulatory referral to Podiatry  Paresthesia of both feet -     Ambulatory referral to Podiatry  Onychomycosis -     Ambulatory referral to Podiatry  Right ankle pain and bilateral paresthesias with hyperpigmentation of bilateral ankles.  Advised symptoms likely 2/2 flatfeet causing inversion of ankle right greater than left.  Paresthesias likely from nerve compression from inversion.  Continue supportive care.  Referred to podiatry for insoles and other interventions.    Return if symptoms worsen or fail to improve.   Clotilda JONELLE Single, MD

## 2024-12-01 ENCOUNTER — Ambulatory Visit: Admitting: Podiatry

## 2024-12-01 ENCOUNTER — Ambulatory Visit

## 2024-12-01 DIAGNOSIS — M7752 Other enthesopathy of left foot: Secondary | ICD-10-CM | POA: Diagnosis not present

## 2024-12-01 DIAGNOSIS — M778 Other enthesopathies, not elsewhere classified: Secondary | ICD-10-CM

## 2024-12-01 DIAGNOSIS — M7751 Other enthesopathy of right foot: Secondary | ICD-10-CM | POA: Diagnosis not present

## 2024-12-01 DIAGNOSIS — M76821 Posterior tibial tendinitis, right leg: Secondary | ICD-10-CM

## 2024-12-01 MED ORDER — TRIAMCINOLONE ACETONIDE 10 MG/ML IJ SUSP
10.0000 mg | Freq: Once | INTRAMUSCULAR | Status: AC
  Administered 2024-12-01: 10 mg via INTRA_ARTICULAR

## 2024-12-01 MED ORDER — DICLOFENAC SODIUM 75 MG PO TBEC: 75.0000 mg | DELAYED_RELEASE_TABLET | Freq: Two times a day (BID) | ORAL | 2 refills | Status: AC

## 2024-12-03 NOTE — Progress Notes (Signed)
 Subjective:   Patient ID: Julie Harrington, female   DOB: 48 y.o.   MRN: [id number]   HPI Patient presents stating she has developed a lot of pain on the inside of her right ankle and she was walking with a flatter shoe and it seemed to start after that.  States that there has been swelling and it is hard to bear weight on the foot.  Patient does not smoke likes to be active   Review of Systems  All other systems reviewed and are negative.       Objective:  Physical Exam Vitals and nursing note reviewed.  Constitutional:      Appearance: She is well-developed.  Pulmonary:     Effort: Pulmonary effort is normal.  Musculoskeletal:        General: Normal range of motion.  Skin:    General: Skin is warm.  Neurological:     Mental Status: She is alert.     Neurovascular status intact muscle strength adequate range of motion within normal limits with patient found to have acute inflammation of the medial side of the right ankle with fluid buildup around the area no loss of integrity of the tendon currently with a lot of straining with moderate flatfoot deformity.  Good digital perfusion well-oriented x 3     Assessment:  Acute posterior tibial tendinitis right inflammation fluid around the inside of the posterior tib no indication currently of tear     Plan:  H&P x-ray taken education concerning condition reviewed with patient and I have recommended an aggressive conservative approach sterile prep carefully injected the sheath explaining chances for rupture for future MRI 3 mg dexamethasone  Kenalog  5 mg Xylocaine  applied fascial brace to lift the arch properly fitted into the arch advised on support shoes placed on oral anti-inflammatory reappoint may require orthotic treatment  X-rays indicate moderate depression of the arch with stress on the medial ankle

## 2024-12-19 ENCOUNTER — Ambulatory Visit: Admitting: Podiatry

## 2024-12-22 ENCOUNTER — Ambulatory Visit: Admitting: Podiatry

## 2024-12-22 ENCOUNTER — Encounter: Payer: Self-pay | Admitting: Podiatry

## 2024-12-22 DIAGNOSIS — M76821 Posterior tibial tendinitis, right leg: Secondary | ICD-10-CM

## 2024-12-24 NOTE — Progress Notes (Signed)
 Subjective:   Patient ID: Julie Harrington, female   DOB: 49 y.o.   MRN: [id number]   HPI Patient states that currently she is improved with reduced discomfort in her foot   ROS      Objective:  Physical Exam  Neurovascular status intact discomfort in the right medial ankle that has improved with good muscle strength     Assessment:  Improved posterior tibial tendinitis right     Plan:  Recommended the continuation of good supportive shoes and bracing and ice and if symptoms persist may require orthotics or immobilization or possible MRI if it gets bad again.  Explained all this to her

## 2024-12-28 ENCOUNTER — Other Ambulatory Visit: Payer: Self-pay | Admitting: Family Medicine

## 2025-01-01 ENCOUNTER — Telehealth: Payer: Self-pay

## 2025-01-01 NOTE — Telephone Encounter (Signed)
 I called and spoke to pt. I wanted to know if pt was still actively using a CPAP machine and pt verified she was. Pt is not fully sure where she received it from but stated it may be from [employer]. I asked pt if she had an SD card and she verbalized she did. I asked pt if she would bring this in. Pt stated she would. NFN

## 2025-01-02 ENCOUNTER — Encounter: Payer: Self-pay | Admitting: Primary Care

## 2025-01-02 ENCOUNTER — Ambulatory Visit: Admitting: Primary Care

## 2025-01-02 VITALS — BP 136/74 | HR 89 | Temp 97.5°F | Ht 66.0 in | Wt 236.6 lb

## 2025-01-02 DIAGNOSIS — F17210 Nicotine dependence, cigarettes, uncomplicated: Secondary | ICD-10-CM | POA: Diagnosis not present

## 2025-01-02 DIAGNOSIS — G4733 Obstructive sleep apnea (adult) (pediatric): Secondary | ICD-10-CM | POA: Diagnosis not present

## 2025-01-02 NOTE — Patient Instructions (Signed)
" °  VISIT SUMMARY: During your visit, we discussed your sleep apnea and current CPAP usage. You mentioned that your CPAP machine is not accurately recording your sleep, and your usage is affected by your night shift work schedule. You feel good upon waking and do not experience significant daytime sleepiness.  YOUR PLAN: -OBSTRUCTIVE SLEEP APNEA: Obstructive sleep apnea is a condition where your breathing stops and starts repeatedly during sleep due to blocked upper airways. Your current CPAP usage is 87% over the last 90 days, with an average usage of 3 hours and 15 minutes per night. This is below the recommended 4 hours per night, likely due to your night shift work schedule. Your apnea-hypopnea index (AHI) is 1.3, indicating well-controlled sleep apnea. We recommend continuing your current CPAP settings and encourage you to increase your CPAP usage to more than 4 hours per night to meet insurance requirements. We will schedule a virtual follow-up visit in June for CPAP machine replacement. Additionally, please set up MyChart for non-urgent communication with providers.  INSTRUCTIONS: Please increase your CPAP usage to more than 4 hours per night to meet insurance requirements. We will schedule a virtual follow-up visit in June for CPAP machine replacement. Additionally, set up MyChart for non-urgent communication with providers.  Follow-up June with Landry NP- virtual ok   Contains text generated by [employer].   "

## 2025-01-02 NOTE — Progress Notes (Signed)
 "  @Patient  ID: Julie Harrington, female    DOB: 06-Dec-1976, 49 y.o.   MRN: [id number]  Chief Complaint  Patient presents with   Establish Care   Sleep Apnea    Referring provider: Mercer Clotilda SAUNDERS, MD  HPI: 49 year old female, current every day smoker. PMH significant for OSA on CPAP, bipolar disease, ADHD, obesity, tobacco dependency.   01/02/2025 Discussed the use of AI scribe software for clinical note transcription with the patient, who gave verbal consent to proceed.  History of Present Illness Julie Harrington is a 49 year old female with sleep apnea who presents to establish with our office, previously followed by [facility].   She was diagnosed with sleep apnea approximately five years ago following a home sleep study. She currently uses a CPAP machine, which she feels is not accurately recording her sleep. The machine's data shows 87% usage over the last 90 days, but only 48% of the time for more than four hours per session. 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  She feels good upon waking and does not experience significant daytime sleepiness, though she feels tired after meals. No episodes of falling asleep during activities such as driving or conversing. She smokes but denies any breathing issues, COPD, or asthma.  Her CPAP settings are on auto set with a minimum pressure of 4 and a maximum of 18, with an average usage pressure of 14. She is currently managing her supplies online. She previously received care at [facility] but had to discontinue due to missed appointments. Due for new CPAP machine in June 2026.         Allergies[1]  Immunization History  Administered Date(s) Administered   Dtap, Unspecified 04/26/1987   Hep B, Unspecified 05/30/2020   Hepatitis B, ADULT 03/15/2016, 06/01/2020, 06/22/2021, 08/17/2021   Influenza, Quadrivalent, Recombinant, Inj, Pf  08/30/2020, 08/17/2021   Influenza,inj,Quad PF,6+ Mos 02/22/2016, 12/16/2018   MMR 07/20/1986, 06/01/2020, 06/06/2021   Moderna Sars-Covid-2 Vaccination 12/29/2019, 04/01/2020   PPD Test 06/14/2020   Polio, Unspecified 04/26/1987   Tdap 02/22/2016, 06/06/2021   Varicella 03/15/2016    Past Medical History:  Diagnosis Date   ADHD    Asthma    Bipolar disorder (HCC)    Depression    Eczema    Hypertension    Sleep apnea     Tobacco History: Tobacco Use History[2] Ready to quit: Not Answered Counseling given: Not Answered Tobacco comments: quit 12/2020   Outpatient Medications Prior to Visit  Medication Sig Dispense Refill   amLODipine  (NORVASC ) 10 MG tablet TAKE 1 TABLET(10 MG) BY MOUTH DAILY 30 tablet 0   IRON, FERROUS SULFATE, PO Take by mouth.     risperiDONE (RISPERDAL) 0.25 MG tablet Take 6 mg by mouth at bedtime.     triamcinolone  cream (KENALOG ) 0.1 % Apply 1 Application topically 2 (two) times daily. 80 g 1   baclofen  (LIORESAL ) 10 MG tablet Take 0.5-1 tablets (5-10 mg total) by mouth at bedtime as needed for muscle spasms (cramps). (Patient not taking: Reported on 01/02/2025) 30 each 0   diclofenac  (VOLTAREN ) 75 MG EC tablet Take 1 tablet (75 mg total) by mouth 2 (two) times daily. (Patient not taking: Reported on 01/02/2025) 50 tablet 2   nicotine  (NICODERM CQ ) 21 mg/24hr patch Place 1 patch (21 mg total) onto the skin daily. (Patient not taking: Reported  on 01/02/2025) 28 patch 1   No facility-administered medications prior to visit.   Review of Systems  Review of Systems  Constitutional: Negative.   Respiratory: Negative.     Physical Exam  BP 136/74 (BP Location: Right Arm, Patient Position: Sitting, Cuff Size: Large)   Pulse 89   Temp (!) 97.5 F (36.4 C)   Ht 5' 6 (1.676 m)   Wt 236 lb 9.6 oz (107.3 kg)   SpO2 97%   BMI 38.19 kg/m  Physical Exam Constitutional:      Appearance: Normal appearance. She is well-developed.  HENT:     Head:  Normocephalic and atraumatic.     Mouth/Throat:     Mouth: Mucous membranes are moist.     Pharynx: Oropharynx is clear.  Eyes:     Pupils: Pupils are equal, round, and reactive to light.  Cardiovascular:     Rate and Rhythm: Normal rate and regular rhythm.     Heart sounds: Normal heart sounds. No murmur heard. Pulmonary:     Effort: Pulmonary effort is normal. No respiratory distress.     Breath sounds: Normal breath sounds. No wheezing or rhonchi.  Musculoskeletal:        General: Normal range of motion.     Cervical back: Normal range of motion and neck supple.  Skin:    General: Skin is warm and dry.     Findings: No erythema or rash.  Neurological:     General: No focal deficit present.     Mental Status: She is alert and oriented to person, place, and time. Mental status is at baseline.  Psychiatric:        Mood and Affect: Mood normal.        Behavior: Behavior normal.        Thought Content: Thought content normal.        Judgment: Judgment normal.      Lab Results:  CBC    Component Value Date/Time   WBC 7.2 02/22/2024 1014   WBC 10.8 04/02/2020 1057   RBC 3.98 02/22/2024 1014   RBC 4.15 04/02/2020 1057   HGB 12.0 02/22/2024 1014   HCT 36.6 02/22/2024 1014   PLT 217 02/22/2024 1014   MCV 92 02/22/2024 1014   MCH 30.2 02/22/2024 1014   MCH 30.1 04/02/2020 1057   MCHC 32.8 02/22/2024 1014   MCHC 32.6 04/02/2020 1057   RDW 12.8 02/22/2024 1014   LYMPHSABS 1,652 04/02/2020 1057   MONOABS 0.6 03/16/2020 0428   EOSABS 248 04/02/2020 1057   BASOSABS 22 04/02/2020 1057    BMET    Component Value Date/Time   NA 143 02/22/2024 1014   K 4.2 02/22/2024 1014   CL 107 (H) 02/22/2024 1014   CO2 21 02/22/2024 1014   GLUCOSE 90 02/22/2024 1014   GLUCOSE 100 04/02/2020 1057   BUN 12 02/22/2024 1014   CREATININE 1.05 (H) 02/22/2024 1014   CREATININE 1.08 04/02/2020 1057   CALCIUM 9.9 02/22/2024 1014   GFRNONAA >60 03/16/2020 0428   GFRAA >60 03/16/2020 0428     BNP No results found for: BNP  ProBNP No results found for: PROBNP  Imaging: No results found.   Assessment & Plan:   1. OSA (obstructive sleep apnea) (Primary)  Assessment and Plan Assessment & Plan Obstructive sleep apnea Diagnosed five years ago. Current CPAP usage is 87% over the last 90 days, with an average usage of 3 hours and 15 minutes per night. Compliance is below  the threshold of 4 hours per night, likely due to night shift work schedule. Apnea-hypopnea index (AHI) is 1.3, indicating well-controlled sleep apnea. Current CPAP settings are on auto mode with a minimum pressure of 4 cm H2O and a maximum pressure of 18 cm H2O, with an average pressure of 14 cm H2O. No significant daytime sleepiness reported. No issues with breathing or COPD/asthma. - Continue current CPAP nightly 4-6 hours with settings 4-18cm h20  - Encouraged increasing CPAP usage to more than 4 hours per night to meet insurance requirements. - Will schedule a virtual follow-up visit in June for CPAP machine replacement. - Advised setting up MyChart for non-urgent communication with providers.    Almarie LELON Ferrari, NP 01/02/2025     [1]  Allergies Allergen Reactions   Wellbutrin [Bupropion] Other (See Comments)    paranoia   Latex Rash  [2]  Social History Tobacco Use  Smoking Status Every Day   Current packs/day: 1.00   Average packs/day: 1 pack/day for 20.0 years (20.0 ttl pk-yrs)   Types: Cigarettes   Passive exposure: Current  Smokeless Tobacco Never  Tobacco Comments   quit 12/2020   "

## 2025-01-08 ENCOUNTER — Telehealth: Payer: Self-pay

## 2025-01-08 NOTE — Telephone Encounter (Signed)
 Patient is sch for a cpe

## 2025-01-08 NOTE — Telephone Encounter (Signed)
 Copied from CRM [id number]. Topic: Appointments - Appointment Scheduling >> Jan 07, 2025  4:35 PM Tonda B wrote: Patient/patient representative is calling to schedule an appointment. Refer to attachments for appointment information.  Patient needs a lab order

## 2025-01-14 ENCOUNTER — Ambulatory Visit (INDEPENDENT_AMBULATORY_CARE_PROVIDER_SITE_OTHER): Admitting: Family Medicine

## 2025-01-14 ENCOUNTER — Encounter: Payer: Self-pay | Admitting: Family Medicine

## 2025-01-14 VITALS — BP 132/74 | HR 80 | Temp 97.9°F | Ht 66.0 in | Wt 238.0 lb

## 2025-01-14 DIAGNOSIS — Z131 Encounter for screening for diabetes mellitus: Secondary | ICD-10-CM

## 2025-01-14 DIAGNOSIS — L309 Dermatitis, unspecified: Secondary | ICD-10-CM

## 2025-01-14 DIAGNOSIS — R0981 Nasal congestion: Secondary | ICD-10-CM | POA: Diagnosis not present

## 2025-01-14 DIAGNOSIS — I10 Essential (primary) hypertension: Secondary | ICD-10-CM

## 2025-01-14 DIAGNOSIS — Z13220 Encounter for screening for lipoid disorders: Secondary | ICD-10-CM

## 2025-01-14 DIAGNOSIS — G4733 Obstructive sleep apnea (adult) (pediatric): Secondary | ICD-10-CM | POA: Diagnosis not present

## 2025-01-14 DIAGNOSIS — Z0001 Encounter for general adult medical examination with abnormal findings: Secondary | ICD-10-CM | POA: Diagnosis not present

## 2025-01-14 DIAGNOSIS — Z0000 Encounter for general adult medical examination without abnormal findings: Secondary | ICD-10-CM

## 2025-01-14 LAB — CBC WITH DIFFERENTIAL/PLATELET
Basophils Absolute: 0 10*3/uL (ref 0.0–0.1)
Basophils Relative: 0.6 % (ref 0.0–3.0)
Eosinophils Absolute: 0.3 10*3/uL (ref 0.0–0.7)
Eosinophils Relative: 3.5 % (ref 0.0–5.0)
HCT: 37.5 % (ref 36.0–46.0)
Hemoglobin: 12.4 g/dL (ref 12.0–15.0)
Lymphocytes Relative: 28.2 % (ref 12.0–46.0)
Lymphs Abs: 2.2 10*3/uL (ref 0.7–4.0)
MCHC: 33 g/dL (ref 30.0–36.0)
MCV: 92 fl (ref 78.0–100.0)
Monocytes Absolute: 0.8 10*3/uL (ref 0.1–1.0)
Monocytes Relative: 10 % (ref 3.0–12.0)
Neutro Abs: 4.5 10*3/uL (ref 1.4–7.7)
Neutrophils Relative %: 57.7 % (ref 43.0–77.0)
Platelets: 228 10*3/uL (ref 150.0–400.0)
RBC: 4.08 Mil/uL (ref 3.87–5.11)
RDW: 14.5 % (ref 11.5–15.5)
WBC: 7.8 10*3/uL (ref 4.0–10.5)

## 2025-01-14 LAB — COMPREHENSIVE METABOLIC PANEL WITH GFR
ALT: 20 U/L (ref 3–35)
AST: 16 U/L (ref 5–37)
Albumin: 4.6 g/dL (ref 3.5–5.2)
Alkaline Phosphatase: 66 U/L (ref 39–117)
BUN: 13 mg/dL (ref 6–23)
CO2: 30 meq/L (ref 19–32)
Calcium: 9.7 mg/dL (ref 8.4–10.5)
Chloride: 106 meq/L (ref 96–112)
Creatinine, Ser: 1.16 mg/dL (ref 0.40–1.20)
GFR: 55.71 mL/min — ABNORMAL LOW
Glucose, Bld: 88 mg/dL (ref 70–99)
Potassium: 3.6 meq/L (ref 3.5–5.1)
Sodium: 144 meq/L (ref 135–145)
Total Bilirubin: 0.5 mg/dL (ref 0.2–1.2)
Total Protein: 7.6 g/dL (ref 6.0–8.3)

## 2025-01-14 LAB — LIPID PANEL
Cholesterol: 161 mg/dL (ref 28–200)
HDL: 41.6 mg/dL
LDL Cholesterol: 104 mg/dL — ABNORMAL HIGH (ref 10–99)
NonHDL: 119.63
Total CHOL/HDL Ratio: 4
Triglycerides: 76 mg/dL (ref 10.0–149.0)
VLDL: 15.2 mg/dL (ref 0.0–40.0)

## 2025-01-14 LAB — HEMOGLOBIN A1C: Hgb A1c MFr Bld: 5.8 % (ref 4.6–6.5)

## 2025-01-14 LAB — TSH: TSH: 4 u[IU]/mL (ref 0.35–5.50)

## 2025-01-14 LAB — T4, FREE: Free T4: 0.67 ng/dL (ref 0.60–1.60)

## 2025-01-14 MED ORDER — AMLODIPINE BESYLATE 10 MG PO TABS: 10.0000 mg | ORAL_TABLET | Freq: Every day | ORAL | 3 refills | Status: AC

## 2025-01-14 MED ORDER — TRIAMCINOLONE ACETONIDE 0.1 % EX CREA: 1.0000 | TOPICAL_CREAM | Freq: Two times a day (BID) | CUTANEOUS | 1 refills | Status: AC

## 2025-01-14 NOTE — Progress Notes (Signed)
 "  Established Patient Office Visit   Subjective  Patient ID: Julie Harrington, female    DOB: 1976-12-11  Age: 49 y.o. MRN: [id number]  Chief Complaint  Patient presents with   Annual Exam    Patient is a 49 year old female seen for CPE.  Patient states she started having nasal congestion overnight while at work.  Has not taken anything for symptoms.  Unsure of sick contacts.  Does states she was outside in the cold smoking cigarette.  Otherwise patient doing okay.  Inquires about name of OTC cleanser for recurrent boils in groin previously mentioned. Requesting refill on Norvasc  and triamcinolone  cream.     Patient Active Problem List   Diagnosis Date Noted   Tobacco dependence 02/22/2024   OSA on CPAP 02/22/2024   Class 2 severe obesity due to excess calories with serious comorbidity and body mass index (BMI) of 38.0 to 38.9 in adult 02/22/2024   Attention deficit hyperactivity disorder (ADHD) 02/22/2024   Past Medical History:  Diagnosis Date   ADHD    Asthma    Bipolar disorder (HCC)    Depression    Eczema    Hypertension    Sleep apnea    Past Surgical History:  Procedure Laterality Date   BREAST REDUCTION SURGERY Bilateral 04/12/2021   Procedure: MAMMARY REDUCTION  (BREAST);  Surgeon: Elisabeth Craig RAMAN, MD;  Location: [facility];  Service: Plastics;  Laterality: Bilateral;   CHOLECYSTECTOMY     REDUCTION MAMMAPLASTY     Social History[1] Family History  Problem Relation Age of Onset   Diabetes Mother    High blood pressure Mother    Stroke Mother    Diabetes Father    Drug abuse Maternal Grandmother    Hypertension Other    Diabetes Other    Allergies[2]  ROS Negative unless stated above    Objective:     BP 132/74 (BP Location: Left Arm, Patient Position: Sitting, Cuff Size: Large)   Pulse 80   Temp 97.9 F (36.6 C) (Oral)   Ht 5' 6 (1.676 m)   Wt 238 lb (108 kg)   LMP  (LMP Unknown)   SpO2 99%   BMI 38.41 kg/m  BP Readings  from Last 3 Encounters:  01/14/25 132/74  01/02/25 136/74  11/17/24 120/80   Wt Readings from Last 3 Encounters:  01/14/25 238 lb (108 kg)  01/02/25 236 lb 9.6 oz (107.3 kg)  11/17/24 235 lb 9.6 oz (106.9 kg)      Physical Exam Constitutional:      Appearance: Normal appearance.  HENT:     Head: Normocephalic and atraumatic.     Right Ear: Ear canal and external ear normal. There is impacted cerumen.     Left Ear: Tympanic membrane, ear canal and external ear normal.     Nose: Nose normal.     Right Turbinates: Swollen.     Left Turbinates: Swollen.     Mouth/Throat:     Mouth: Mucous membranes are moist.     Pharynx: No oropharyngeal exudate or posterior oropharyngeal erythema.  Eyes:     General: No scleral icterus.    Extraocular Movements: Extraocular movements intact.     Conjunctiva/sclera: Conjunctivae normal.     Pupils: Pupils are equal, round, and reactive to light.  Neck:     Thyroid: No thyromegaly.     Vascular: No carotid bruit.  Cardiovascular:     Rate and Rhythm: Normal rate and regular rhythm.  Pulses: Normal pulses.     Heart sounds: Normal heart sounds. No murmur heard.    No friction rub.  Pulmonary:     Effort: Pulmonary effort is normal.     Breath sounds: Normal breath sounds. No wheezing, rhonchi or rales.  Abdominal:     General: Bowel sounds are normal.     Palpations: Abdomen is soft.     Tenderness: There is no abdominal tenderness.  Musculoskeletal:        General: No deformity. Normal range of motion.  Lymphadenopathy:     Cervical: No cervical adenopathy.  Skin:    General: Skin is warm and dry.     Findings: No lesion.  Neurological:     General: No focal deficit present.     Mental Status: She is alert and oriented to person, place, and time.  Psychiatric:        Mood and Affect: Mood normal.        Thought Content: Thought content normal.        01/14/2025    8:55 AM 11/17/2024    9:30 AM 10/17/2024    5:01 PM   Depression screen PHQ 2/9  Decreased Interest 0 0 0  Down, Depressed, Hopeless 0 0 0  PHQ - 2 Score 0 0 0  Altered sleeping 0 0 0  Tired, decreased energy 1 0 0  Change in appetite 0 0 0  Feeling bad or failure about yourself  0 0 0  Trouble concentrating 0 0 0  Moving slowly or fidgety/restless 0 0 0  Suicidal thoughts 0 0 0  PHQ-9 Score 1 0 0      01/14/2025    8:55 AM 11/17/2024    9:30 AM 10/17/2024    5:03 PM 07/17/2024   12:29 PM  GAD 7 : Generalized Anxiety Score  Nervous, Anxious, on Edge 0 0  0  0   Control/stop worrying 0 0  0  0   Worry too much - different things 0 0  0  0   Trouble relaxing 0 0  0  0   Restless 0 0  0  1   Easily annoyed or irritable 0 0  0  0   Afraid - awful might happen 0 0  0  0   Total GAD 7 Score 0 0 0 1  Anxiety Difficulty   Not difficult at all Not difficult at all     Data saved with a previous flowsheet row definition     No results found for any visits on 01/14/25.    Assessment & Plan:   Well adult exam -     CBC with Differential/Platelet; Future -     Comprehensive metabolic panel with GFR; Future -     Hemoglobin A1c; Future -     Lipid panel; Future -     T4, free; Future -     TSH; Future  Essential hypertension -     amLODIPine  Besylate; Take 1 tablet (10 mg total) by mouth daily.  Dispense: 90 tablet; Refill: 3  OSA on CPAP  Eczema, unspecified type -     Triamcinolone  Acetonide; Apply 1 Application topically 2 (two) times daily.  Dispense: 80 g; Refill: 1  Nasal congestion  Age appropriate health screenings discussed.  Obtain labs.  Immunizations reviewed.  Offered COVID testing given current nasal congestion and fatigue.  Patient declines.  Supportive care with OTC cough/cold medications as needed.  BP controlled.  Continue  Norvasc  10 mg daily.  Refill provided.  Lifestyle modifications encouraged.  Continue wearing CPAP nightly.  Patient to obtain new machine and follow-up with pulm in 6 months.  Triamcinolone  as  needed for eczema.  Next CPE in 1 year.  Follow-up sooner if needed for other concerns.   Return if symptoms worsen or fail to improve.   Clotilda JONELLE Single, MD     [1]  Social History Tobacco Use   Smoking status: Every Day    Current packs/day: 1.00    Average packs/day: 1 pack/day for 20.0 years (20.0 ttl pk-yrs)    Types: Cigarettes    Passive exposure: Current   Smokeless tobacco: Never   Tobacco comments:    quit 12/2020  Vaping Use   Vaping status: Never Used  Substance Use Topics   Alcohol use: Yes   Drug use: No  [2]  Allergies Allergen Reactions   Wellbutrin [Bupropion] Other (See Comments)    paranoia   Latex Rash   "

## 2025-05-15 ENCOUNTER — Ambulatory Visit: Admitting: Primary Care
